# Patient Record
Sex: Female | Born: 1960 | Race: White | Hispanic: No | Marital: Married | Smoking: Current every day smoker
Health system: Southern US, Community
[De-identification: ages and names within clinical notes are randomized; demographics above are authoritative.]

## PROBLEM LIST (undated history)

## (undated) DIAGNOSIS — I639 Cerebral infarction, unspecified: Secondary | ICD-10-CM

## (undated) DIAGNOSIS — F32A Depression, unspecified: Secondary | ICD-10-CM

## (undated) DIAGNOSIS — R011 Cardiac murmur, unspecified: Secondary | ICD-10-CM

## (undated) DIAGNOSIS — J449 Chronic obstructive pulmonary disease, unspecified: Secondary | ICD-10-CM

## (undated) DIAGNOSIS — C569 Malignant neoplasm of unspecified ovary: Secondary | ICD-10-CM

## (undated) DIAGNOSIS — F419 Anxiety disorder, unspecified: Secondary | ICD-10-CM

## (undated) DIAGNOSIS — G43909 Migraine, unspecified, not intractable, without status migrainosus: Secondary | ICD-10-CM

## (undated) DIAGNOSIS — M069 Rheumatoid arthritis, unspecified: Secondary | ICD-10-CM

## (undated) DIAGNOSIS — J45909 Unspecified asthma, uncomplicated: Secondary | ICD-10-CM

## (undated) DIAGNOSIS — I509 Heart failure, unspecified: Secondary | ICD-10-CM

## (undated) DIAGNOSIS — Z9289 Personal history of other medical treatment: Secondary | ICD-10-CM

## (undated) DIAGNOSIS — J42 Unspecified chronic bronchitis: Secondary | ICD-10-CM

## (undated) DIAGNOSIS — D649 Anemia, unspecified: Secondary | ICD-10-CM

## (undated) DIAGNOSIS — E785 Hyperlipidemia, unspecified: Secondary | ICD-10-CM

## (undated) DIAGNOSIS — G709 Myoneural disorder, unspecified: Secondary | ICD-10-CM

## (undated) DIAGNOSIS — F329 Major depressive disorder, single episode, unspecified: Secondary | ICD-10-CM

## (undated) HISTORY — PX: COLONOSCOPY: SHX174

## (undated) HISTORY — PX: TOTAL HIP ARTHROPLASTY: SHX124

## (undated) HISTORY — PX: JOINT REPLACEMENT: SHX530

---
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
## 1979-01-28 HISTORY — PX: RADIAL HEAD ARTHROPLASTY: SUR75

## 2008-11-12 ENCOUNTER — Ambulatory Visit (HOSPITAL_BASED_OUTPATIENT_CLINIC_OR_DEPARTMENT_OTHER): Admission: RE | Admit: 2008-11-12 | Discharge: 2008-11-12 | Payer: Self-pay | Admitting: Orthopedic Surgery

## 2009-05-29 HISTORY — PX: ABDOMINAL HYSTERECTOMY: SHX81

## 2010-01-13 ENCOUNTER — Emergency Department (HOSPITAL_COMMUNITY)
Admission: EM | Admit: 2010-01-13 | Discharge: 2010-01-14 | Payer: Self-pay | Source: Home / Self Care | Admitting: Emergency Medicine

## 2010-04-28 DIAGNOSIS — I639 Cerebral infarction, unspecified: Secondary | ICD-10-CM

## 2010-04-28 HISTORY — DX: Cerebral infarction, unspecified: I63.9

## 2010-05-17 ENCOUNTER — Encounter (INDEPENDENT_AMBULATORY_CARE_PROVIDER_SITE_OTHER): Payer: Self-pay | Admitting: Neurology

## 2010-05-17 ENCOUNTER — Inpatient Hospital Stay (HOSPITAL_COMMUNITY)
Admission: EM | Admit: 2010-05-17 | Discharge: 2010-05-20 | Disposition: A | Discharge status: Rehabilitation Facility | Payer: Self-pay | Source: Home / Self Care | Attending: Neurology | Admitting: Neurology

## 2010-05-18 ENCOUNTER — Encounter (INDEPENDENT_AMBULATORY_CARE_PROVIDER_SITE_OTHER): Payer: Self-pay | Admitting: Neurology

## 2010-05-20 ENCOUNTER — Inpatient Hospital Stay (HOSPITAL_COMMUNITY)
Admission: RE | Admit: 2010-05-20 | Discharge: 2010-05-27 | Payer: Self-pay | Admitting: Physical Medicine & Rehabilitation

## 2010-05-23 ENCOUNTER — Inpatient Hospital Stay (HOSPITAL_COMMUNITY)
Admission: RE | Admit: 2010-05-23 | Discharge: 2010-05-27 | Payer: Self-pay | Attending: Physical Medicine & Rehabilitation | Admitting: Physical Medicine & Rehabilitation

## 2010-05-30 ENCOUNTER — Encounter
Admission: RE | Admit: 2010-05-30 | Discharge: 2010-06-14 | Payer: Self-pay | Source: Home / Self Care | Attending: Physical Medicine & Rehabilitation | Admitting: Physical Medicine & Rehabilitation

## 2010-06-19 ENCOUNTER — Encounter: Payer: Self-pay | Admitting: Physical Medicine & Rehabilitation

## 2010-06-23 NOTE — H&P (Addendum)
NAME:  Anne Stevens, Anne Stevens NO.:  [id number]  MEDICAL RECORD NO.:  [id number]         PATIENT TYPE:  INP  LOCATION:  3111                         FACILITY:  [facility]  PHYSICIAN:  Marlan Palau, M.D.  DATE OF BIRTH:  23-Dec-1960  DATE OF ADMISSION:  05/17/2010 DATE OF DISCHARGE:                             HISTORY & PHYSICAL   HISTORY OF PRESENT ILLNESS:  Anne Stevens is a 50 year old right-handed white female, born on November 19, 1960, with a history of tobacco abuse. The patient has recently been diagnosed with ovarian cancer in August 2011 and had surgery to remove tumor including hysterectomy in September 2011.  The patient was felt to have tumor that was localized to the pelvis without distant metastasis.  The patient has been undergoing chemotherapy, has had three sessions of this.  The patient was in otherwise fairly good health until the day of admission.  Around 10 minutes still 10 o'clock in the morning today, the patient was noted to have onset of speech disturbance and right facial droop that was noted by her husband.  The patient was last seen normal within 10 minutes of that time.  The patient developed right-sided weakness, the EMS was called.  The patient was brought into the hospital and appeared to be somewhat mute, unable to generate language with wipeout of the right arm and severe weakness of the right leg and right homonymous visual field deficit.  CT scan of the brain was done and revealed no acute changes. CT angiogram was performed and showed patent carotid system on the left and M1 segment was patent.  There appear to be some branches cutoffs apparent on CT angiogram.  The patient was admitted to [facility] after full dosed IV t-PA.  PAST MEDICAL HISTORY:  Significant for: 1. Ovarian cancer, status post resection. 2. Tobacco abuse. 3. Rheumatoid arthritis. 4. Depression and anxiety. 5. Ovarian resection, age 11 for benign  lesion.  The patient is on; 1. Promethazine 25 mg q.4 h. if needed. 2. Zofran 8 mg q.8 h. if needed. 3. Iron 325 mg twice daily. 4. Neurontin 600 mg three times daily. 5. Valacyclovir 500 mg daily. 6. Effexor 150 mg b.i.d. 7. Clonazepam 1 mg twice daily. 8. The patient takes Dilaudid 2 mg tablets every 6 hours if needed. 9. Takes prednisone 10 mg daily. 10.Advair inhaler 250/50 two puffs twice daily. 11.Docusate 100 mg daily.  The patient has no known allergies.  Smokes a pack of cigarettes daily. Does drink alcohol on occasion.  Again no known drug allergies.  FAMILY MEDICAL HISTORY:  Notable for mother died with stroke.  Father died with the heart attack.  The patient has three brothers, no sisters. Two brothers have heart disease.  One brother is alive and well.  SOCIAL HISTORY:  The patient is married, has no children, was on disability for her rheumatoid arthritis, lives in the [place] area.  The patient has no local primary care physician.  The patient has followed through [facility]  REVIEW OF SYSTEMS:  Notable for as per husband some chronic shortness of breath.  The patient  has had intermittent episodes of right hand and arm numbness, possibly some clumsiness over the last 6-7 weeks that would come and go.  The patient would have occasional headaches.  Has some problems with constipation.  Denies any problems with bladder control.  PHYSICAL EXAMINATION:  VITAL SIGNS:  Blood pressure is 146/96, heart rate 76, respiratory rate 16, temperature afebrile. GENERAL:  The patient is a minimally obese, white female who is alert, aphasic at the time of examination. HEENT:  Head is atraumatic.  Eyes; pupils are equal, round, and reactive to light. NECK:  Supple.  No carotid bruits noted. RESPIRATORY:  Clear. CARDIOVASCULAR:  Reveals a regular rate and rhythm.  No obvious murmurs or rubs noted. EXTREMITIES:  Without significant edema. ABDOMEN:  Reveals  positive bowel sounds.  No organomegaly or tenderness noted. NEUROLOGIC:  Cranial nerves a above.  Facial symmetry is not present with depression at the right nasolabial fold with smiling grimace.  The patient has difficulty cutting her eyes fully to the right, but could almost get to that point, has good gaze to the left.  The patient blinks to threat from the left, not from the right.  Speech is garbled, nonsensical.  The patient cannot follow pure verbal commands.  The patient does seem to respond to pinprick sensation bilaterally on the face.  The patient has some drift to the bed with the right arm, almost to the bed with the right leg, has no drift on the left side.  The patient appears to have depressed, but symmetric reflexes.  Toes are neutral bilaterally.  Sensory testing is difficult, but the patient does respond to deep pain sensation on all fours.  The patient does not appear to have any obvious ataxia over the left arm or left leg. Difficult to perform on the right side due to weakness.  The patient could not be ambulated.  Testing for double simultaneous stimulation is very difficult.  The patient is unable to read or name objects.  NIH stroke scale score is 14.  CT of the head and CTA of the head and neck are as above.  LABORATORY VALUES:  Notable for a sodium of 135, potassium 3.7, chloride of 107, CO2 17 glucose of 94, BUN of 23, creatinine 1.18.  Total bili of 0.4, alk phosphatase of 79, SGOT of 16, SGPT of 15, total protein 7.0, albumin 3.6, calcium 9.3, CK of 48, MB fraction 1.0, troponin I 0.06. White count 5.0, hemoglobin of 10.2, hematocrit of 29.3, MCV of 93.9, platelets of 111.  Again, INR 0.91.  IMPRESSION: 1. Onset of left brain stroke. 2. History of tobacco abuse. 3. Ovarian cancer.  This patient is felt to be a tPA candidate.  Last surgery was in September 2011 and according to the husband, the cancer is localized at the pelvis and not widely  metastatic.  The patient has had a CT angiogram that did not show any arterial occlusions that were accessible by angiography.  For this reason, the patient will be given full-dosed IV t-PA and admitted to the Intensive Care Unit for further management.  The patient will be follow closely while in-house.     Marlan Palau, M.D.     CKW/MEDQ  D:  05/17/2010  T:  05/18/2010  Job:  [id number]  cc:   [facility]  Electronically Signed by Thana Farr M.D. on 06/23/2010 08:48:38 AM

## 2010-07-05 ENCOUNTER — Inpatient Hospital Stay (HOSPITAL_BASED_OUTPATIENT_CLINIC_OR_DEPARTMENT_OTHER): Payer: Federal, State, Local not specified - PPO | Admitting: Physical Medicine & Rehabilitation

## 2010-07-05 ENCOUNTER — Ambulatory Visit: Payer: Federal, State, Local not specified - PPO | Attending: Physical Medicine & Rehabilitation

## 2010-07-05 DIAGNOSIS — G8190 Hemiplegia, unspecified affecting unspecified side: Secondary | ICD-10-CM | POA: Insufficient documentation

## 2010-07-05 DIAGNOSIS — I69921 Dysphasia following unspecified cerebrovascular disease: Secondary | ICD-10-CM

## 2010-07-05 DIAGNOSIS — I69920 Aphasia following unspecified cerebrovascular disease: Secondary | ICD-10-CM | POA: Insufficient documentation

## 2010-07-05 DIAGNOSIS — Z79899 Other long term (current) drug therapy: Secondary | ICD-10-CM | POA: Insufficient documentation

## 2010-07-05 DIAGNOSIS — C569 Malignant neoplasm of unspecified ovary: Secondary | ICD-10-CM | POA: Insufficient documentation

## 2010-07-05 DIAGNOSIS — Z7902 Long term (current) use of antithrombotics/antiplatelets: Secondary | ICD-10-CM | POA: Insufficient documentation

## 2010-07-05 DIAGNOSIS — IMO0002 Reserved for concepts with insufficient information to code with codable children: Secondary | ICD-10-CM | POA: Insufficient documentation

## 2010-07-05 DIAGNOSIS — I6330 Cerebral infarction due to thrombosis of unspecified cerebral artery: Secondary | ICD-10-CM

## 2010-07-05 DIAGNOSIS — G8100 Flaccid hemiplegia affecting unspecified side: Secondary | ICD-10-CM

## 2010-07-08 NOTE — Discharge Summary (Signed)
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  This is a 50 year old right-handed white female with history of tobacco abuse, recent diagnosis of ovarian cancer in August 2011 with resection and hysterectomy with chemotherapy x3 sessions who was admitted on December 20 with right-sided weakness and aphasia.  MRI showed embolic left middle cerebral artery infarction.  Echocardiogram with ejection fraction 60%.  TEE with no source of emboli.  No PFO noted.  CTA of the neck without stenosis.  CTA of the head normal except for a unopacified insular branch of the left middle cerebral artery consistent with embolic occlusion.  The patient did receive TPA.  Placed on aspirin, subcutaneous Lovenox.  Modified barium swallow December 22, placed on a dysphagia 1 honey thick-liquid diet.  No current plan for chemo at this time to recovery from stroke and then follow up with her oncologist Dr. Noland Fordyce at [facility].  She was total assist for mobility.  She was admitted for comprehensive rehab program.  PAST MEDICAL HISTORY:  See discharge diagnoses.  ALLERGIES:  None.  No alcohol.  She smokes approximately one half to pack per day.  SOCIAL HISTORY:  Married.  She is on disability for rheumatoid arthritis.  Husband works and travels for his job.  She has a brother in the area with good  support.  Living in a 1-level home with 4 steps to entry.  Functional history prior to admission was independent.  Functional status upon admission to rehab services was minimal assist bed mobility, minimal assist transfers, +2 total assist to ambulate 15 feet x2, minimal assist for grooming and toileting.  MEDICATIONS:  Prior to admission were; 1. Flexeril as needed. 2. Ultram as needed. 3. Lutein over-the-counter 1 tablet daily. 4. Fish oil 1 g daily. 5. Folic acid 1 mg daily. 6. Calcium carbonate daily. 7. Celebrex 200 mg daily. 8. Effexor 75 mg daily. 9. Zofran as needed. 10.Percocet every 4 hours as needed. 11.Clonazepam 1 mg twice daily. 12.Vitamin D 50,000 units weekly. 13.Premarin 0.45 mg daily. 14.Prednisone 10 mg every morning. 15.Boniva 150 mg monthly.  PHYSICAL EXAMINATION:  VITAL SIGNS:  Blood pressure 162/95, pulse 81, temperature 97.5, respirations 20. GENERAL:  This was an alert female, anxious, aphasic as well as apraxic. Deep tendon reflexes hyperactive.  Sensation decreased on the right side. LUNGS:  Clear to auscultation. CARDIAC:  Regular rate and rhythm. ABDOMEN:  Soft, nontender.  Good bowel sounds.  REHABILITATION HOSPITAL COURSE:  The patient was admitted to inpatient rehab services with therapies initiated on a 3-hour daily basis consisting of physical therapy, occupational therapy, speech  therapy and rehabilitation nursing.  The following issues were addressed during the patient's rehabilitation stay.  Pertaining to [name]'s left middle cerebral artery infarction with right hemiplegia, remained stable, maintained on subcutaneous Lovenox for deep vein thrombosis prophylaxis throughout her rehab course as well as aspirin therapy.  She would follow up Neurology Services, Dr. Lesia Sago.  Pain management with the use of Vicodin as well as Neurontin 600 mg every 8 hours.  She was followed closely by speech therapy for dysphagia as well as her  aphasia. She was very apraxic.  This had greatly improved.  Her diet was advanced on a dysphagia 1 honey thick-liquid diet to a regular consistency which she tolerated well.  She did have a history of ovarian cancer with resection and chemotherapy.  She would follow up with Dr. Hazle Coca at [facility] for ongoing plan for chemotherapy.  She recovered from her stroke.  She was maintained on low-dose prednisone for her history of rheumatoid arthritis that she was on disability for.  She continued on her Crestor for hyperlipidemia.  She did have a documented history of anemia with latest hemoglobin of 9.1, hematocrit 27, and she continued on iron supplement.  The patient received weekly collaborative interdisciplinary team conferences to discuss estimated length of stay, family teaching and any barriers to discharge.  She was now supervision overall for her mobility, supervision overall for activities of daily living except for some assistance for her shoes.  Her strength and endurance had greatly improve as well as her anxiety.  She was advised of no driving.  Full family teaching was completed and plan was to be discharged to home.  DISCHARGE MEDICATIONS:  At time of dictation included; 1. Aspirin 325 mg daily. 2. Colace 100 mg twice daily. 3. Ferrous sulfate 325 mg twice daily. 4. Neurontin 600 mg every 8 hours. 5. Prednisone 10 mg daily. 6. Crestor 10 mg daily. 7. Valtrex 500 mg daily. 8. Effexor 150 mg twice daily. 9. Klonopin 1 mg every 12 hours. 10.Vicodin 5/325 one or two tablets every 4 hours as needed for pain,     dispense 60 tablets.  Her diet was a regular diet, thin liquids with occasional throat clearing.  Meds could be given whole with thin liquids.  SPECIAL INSTRUCTIONS:  The patient should follow up Dr. Hazle Coca at [facility] for ongoing plan for chemotherapy as related to recent diagnosis of ovarian cancer with resection.   She should follow up with Dr. Lesia Sago of Neurology services.  Questions made in regards to resuming her Premarin, could be addressed per her oncologist at [facility] after recent stroke as this had not been resumed.  She would follow up with Dr. Claudette Laws at the outpatient rehab center as advised.  Outpatient therapies had been arranged.     Mariam Dollar, P.A.   ______________________________ Erick Colace, M.D.    DA/MEDQ  D:  05/27/2010  T:  05/27/2010  Job:  [id number]  cc:   Erick Colace, M.D. Marlan Palau, M.D. Hazle Coca  Electronically Signed by Mariam Dollar P.A. on 06/28/2010 03:08:15 PM Electronically Signed by Claudette Laws M.D. on 07/08/2010 04:23:34 PM

## 2010-08-08 LAB — URINALYSIS, ROUTINE W REFLEX MICROSCOPIC
Bilirubin Urine: NEGATIVE
Glucose, UA: NEGATIVE mg/dL
Nitrite: NEGATIVE
Specific Gravity, Urine: 1.017 (ref 1.005–1.030)
pH: 6 (ref 5.0–8.0)

## 2010-08-08 LAB — GLUCOSE, CAPILLARY
Glucose-Capillary: 109 mg/dL — ABNORMAL HIGH (ref 70–99)
Glucose-Capillary: 150 mg/dL — ABNORMAL HIGH (ref 70–99)
Glucose-Capillary: 75 mg/dL (ref 70–99)
Glucose-Capillary: 97 mg/dL (ref 70–99)

## 2010-08-08 LAB — CBC
HCT: 27.1 % — ABNORMAL LOW (ref 36.0–46.0)
HCT: 29 % — ABNORMAL LOW (ref 36.0–46.0)
HCT: 29.3 % — ABNORMAL LOW (ref 36.0–46.0)
Hemoglobin: 9.6 g/dL — ABNORMAL LOW (ref 12.0–15.0)
MCH: 32.5 pg (ref 26.0–34.0)
MCH: 32.7 pg (ref 26.0–34.0)
MCHC: 33.6 g/dL (ref 30.0–36.0)
MCHC: 33.8 g/dL (ref 30.0–36.0)
MCHC: 33.9 g/dL (ref 30.0–36.0)
MCV: 96.8 fL (ref 78.0–100.0)
Platelets: 74 10*3/uL — ABNORMAL LOW (ref 150–400)
Platelets: 81 10*3/uL — ABNORMAL LOW (ref 150–400)
Platelets: 96 10*3/uL — ABNORMAL LOW (ref 150–400)
RBC: 2.96 MIL/uL — ABNORMAL LOW (ref 3.87–5.11)
RDW: 18.3 % — ABNORMAL HIGH (ref 11.5–15.5)
WBC: 5.9 10*3/uL (ref 4.0–10.5)

## 2010-08-08 LAB — DIFFERENTIAL
Basophils Absolute: 0 10*3/uL (ref 0.0–0.1)
Basophils Absolute: 0 10*3/uL (ref 0.0–0.1)
Basophils Relative: 0 % (ref 0–1)
Basophils Relative: 0 % (ref 0–1)
Eosinophils Absolute: 0 10*3/uL (ref 0.0–0.7)
Eosinophils Absolute: 0 10*3/uL (ref 0.0–0.7)
Eosinophils Relative: 0 % (ref 0–5)
Eosinophils Relative: 1 % (ref 0–5)
Lymphocytes Relative: 50 % — ABNORMAL HIGH (ref 12–46)
Lymphs Abs: 2.7 10*3/uL (ref 0.7–4.0)
Monocytes Absolute: 0.4 10*3/uL (ref 0.1–1.0)
Monocytes Absolute: 0.5 10*3/uL (ref 0.1–1.0)
Monocytes Absolute: 0.6 10*3/uL (ref 0.1–1.0)
Monocytes Relative: 9 % (ref 3–12)
Neutro Abs: 1.2 10*3/uL — ABNORMAL LOW (ref 1.7–7.7)

## 2010-08-08 LAB — COMPREHENSIVE METABOLIC PANEL
ALT: 18 U/L (ref 0–35)
AST: 16 U/L (ref 0–37)
AST: 19 U/L (ref 0–37)
Albumin: 3.3 g/dL — ABNORMAL LOW (ref 3.5–5.2)
Alkaline Phosphatase: 79 U/L (ref 39–117)
BUN: 23 mg/dL (ref 6–23)
CO2: 17 mEq/L — ABNORMAL LOW (ref 19–32)
CO2: 26 mEq/L (ref 19–32)
Calcium: 8.9 mg/dL (ref 8.4–10.5)
Calcium: 9.3 mg/dL (ref 8.4–10.5)
Chloride: 107 mEq/L (ref 96–112)
Chloride: 108 mEq/L (ref 96–112)
GFR calc Af Amer: 59 mL/min — ABNORMAL LOW (ref >60)
GFR calc Af Amer: >60 mL/min (ref >60)
GFR calc non Af Amer: 48 mL/min — ABNORMAL LOW (ref >60)
GFR calc non Af Amer: 57 mL/min — ABNORMAL LOW (ref >60)
Glucose, Bld: 94 mg/dL (ref 70–99)
Potassium: 3.7 mEq/L (ref 3.5–5.1)
Sodium: 142 mEq/L (ref 135–145)

## 2010-08-08 LAB — BASIC METABOLIC PANEL
CO2: 22 mEq/L (ref 19–32)
Chloride: 111 mEq/L (ref 96–112)
Potassium: 4.1 mEq/L (ref 3.5–5.1)

## 2010-08-08 LAB — LIPID PANEL
Cholesterol: 232 mg/dL — ABNORMAL HIGH (ref 0–200)
LDL Cholesterol: 144 mg/dL — ABNORMAL HIGH (ref 0–99)
Triglycerides: 140 mg/dL (ref <150)
VLDL: 28 mg/dL (ref 0–40)

## 2010-08-08 LAB — CK TOTAL AND CKMB (NOT AT ARMC)
Relative Index: INVALID (ref 0.0–2.5)
Total CK: 48 U/L (ref 7–177)

## 2010-08-08 LAB — APTT: aPTT: 32 seconds (ref 24–37)

## 2010-08-08 LAB — MRSA PCR SCREENING: MRSA by PCR: NEGATIVE

## 2010-08-08 LAB — CARDIAC PANEL(CRET KIN+CKTOT+MB+TROPI): Relative Index: INVALID (ref 0.0–2.5)

## 2010-08-08 LAB — HEMOGLOBIN A1C
Hgb A1c MFr Bld: 6.2 % — ABNORMAL HIGH (ref <5.7)
Mean Plasma Glucose: 131 mg/dL — ABNORMAL HIGH (ref <117)

## 2010-08-08 LAB — POCT I-STAT, CHEM 8
Chloride: 109 mEq/L (ref 96–112)
Hemoglobin: 10.2 g/dL — ABNORMAL LOW (ref 12.0–15.0)
Sodium: 137 mEq/L (ref 135–145)

## 2010-08-08 LAB — URINE MICROSCOPIC-ADD ON

## 2010-08-08 LAB — PROTIME-INR: Prothrombin Time: 12.5 seconds (ref 11.6–15.2)

## 2010-08-12 LAB — CBC
HCT: 39.3 % (ref 36.0–46.0)
Hemoglobin: 13.5 g/dL (ref 12.0–15.0)
MCH: 32.8 pg (ref 26.0–34.0)
MCHC: 34.4 g/dL (ref 30.0–36.0)
RBC: 4.12 MIL/uL (ref 3.87–5.11)

## 2010-08-12 LAB — COMPREHENSIVE METABOLIC PANEL
ALT: 11 U/L (ref 0–35)
AST: 17 U/L (ref 0–37)
CO2: 24 mEq/L (ref 19–32)
Chloride: 107 mEq/L (ref 96–112)
GFR calc Af Amer: >60 mL/min (ref >60)
GFR calc non Af Amer: 50 mL/min — ABNORMAL LOW (ref >60)
Glucose, Bld: 103 mg/dL — ABNORMAL HIGH (ref 70–99)
Sodium: 138 mEq/L (ref 135–145)
Total Bilirubin: 0.5 mg/dL (ref 0.3–1.2)

## 2010-08-12 LAB — URINALYSIS, ROUTINE W REFLEX MICROSCOPIC
Bilirubin Urine: NEGATIVE
Glucose, UA: NEGATIVE mg/dL
Hgb urine dipstick: NEGATIVE
Ketones, ur: NEGATIVE mg/dL
Nitrite: NEGATIVE
Specific Gravity, Urine: 1.021 (ref 1.005–1.030)
pH: 6 (ref 5.0–8.0)

## 2010-08-12 LAB — LIPASE, BLOOD: Lipase: 20 U/L (ref 11–59)

## 2010-08-12 LAB — DIFFERENTIAL
Basophils Absolute: 0 10*3/uL (ref 0.0–0.1)
Basophils Relative: 0 % (ref 0–1)
Eosinophils Absolute: 0.1 10*3/uL (ref 0.0–0.7)
Eosinophils Relative: 1 % (ref 0–5)
Neutrophils Relative %: 75 % (ref 43–77)

## 2010-08-12 LAB — POCT PREGNANCY, URINE: Preg Test, Ur: NEGATIVE

## 2010-08-12 LAB — URINE CULTURE
Colony Count: >=100000
Culture  Setup Time: 201108190028

## 2010-08-12 LAB — WET PREP, GENITAL: Clue Cells Wet Prep HPF POC: NONE SEEN

## 2010-08-12 LAB — GC/CHLAMYDIA PROBE AMP, GENITAL: GC Probe Amp, Genital: NEGATIVE

## 2010-08-18 ENCOUNTER — Ambulatory Visit: Payer: Federal, State, Local not specified - PPO | Admitting: Physical Medicine & Rehabilitation

## 2010-08-26 ENCOUNTER — Encounter: Payer: Federal, State, Local not specified - PPO | Attending: Physical Medicine & Rehabilitation

## 2010-08-26 ENCOUNTER — Ambulatory Visit (HOSPITAL_BASED_OUTPATIENT_CLINIC_OR_DEPARTMENT_OTHER): Payer: Federal, State, Local not specified - PPO | Admitting: Physical Medicine & Rehabilitation

## 2010-08-26 DIAGNOSIS — G8110 Spastic hemiplegia affecting unspecified side: Secondary | ICD-10-CM

## 2010-08-26 DIAGNOSIS — I69920 Aphasia following unspecified cerebrovascular disease: Secondary | ICD-10-CM | POA: Insufficient documentation

## 2010-08-26 DIAGNOSIS — I69998 Other sequelae following unspecified cerebrovascular disease: Secondary | ICD-10-CM | POA: Insufficient documentation

## 2010-08-26 DIAGNOSIS — Z9221 Personal history of antineoplastic chemotherapy: Secondary | ICD-10-CM | POA: Insufficient documentation

## 2010-08-26 DIAGNOSIS — R209 Unspecified disturbances of skin sensation: Secondary | ICD-10-CM | POA: Insufficient documentation

## 2010-08-26 DIAGNOSIS — F17200 Nicotine dependence, unspecified, uncomplicated: Secondary | ICD-10-CM | POA: Insufficient documentation

## 2010-08-26 DIAGNOSIS — R279 Unspecified lack of coordination: Secondary | ICD-10-CM | POA: Insufficient documentation

## 2010-08-26 DIAGNOSIS — I69959 Hemiplegia and hemiparesis following unspecified cerebrovascular disease affecting unspecified side: Secondary | ICD-10-CM | POA: Insufficient documentation

## 2010-08-26 DIAGNOSIS — C569 Malignant neoplasm of unspecified ovary: Secondary | ICD-10-CM | POA: Insufficient documentation

## 2010-08-26 DIAGNOSIS — I69921 Dysphasia following unspecified cerebrovascular disease: Secondary | ICD-10-CM

## 2010-09-05 LAB — POCT I-STAT, CHEM 8
BUN: 23 mg/dL (ref 6–23)
Calcium, Ion: 1.15 mmol/L (ref 1.12–1.32)
Creatinine, Ser: 1.3 mg/dL — ABNORMAL HIGH (ref 0.4–1.2)
Glucose, Bld: 101 mg/dL — ABNORMAL HIGH (ref 70–99)
Hemoglobin: 13.3 g/dL (ref 12.0–15.0)
TCO2: 23 mmol/L (ref 0–100)

## 2010-09-14 ENCOUNTER — Ambulatory Visit: Payer: Federal, State, Local not specified - PPO | Admitting: Occupational Therapy

## 2010-09-14 ENCOUNTER — Ambulatory Visit: Payer: Federal, State, Local not specified - PPO | Attending: Physical Medicine & Rehabilitation

## 2010-09-14 DIAGNOSIS — R269 Unspecified abnormalities of gait and mobility: Secondary | ICD-10-CM | POA: Insufficient documentation

## 2010-09-14 DIAGNOSIS — Z5189 Encounter for other specified aftercare: Secondary | ICD-10-CM | POA: Insufficient documentation

## 2010-09-14 DIAGNOSIS — R279 Unspecified lack of coordination: Secondary | ICD-10-CM | POA: Insufficient documentation

## 2010-09-14 DIAGNOSIS — I69998 Other sequelae following unspecified cerebrovascular disease: Secondary | ICD-10-CM | POA: Insufficient documentation

## 2010-09-14 DIAGNOSIS — R482 Apraxia: Secondary | ICD-10-CM | POA: Insufficient documentation

## 2010-09-14 DIAGNOSIS — I69922 Dysarthria following unspecified cerebrovascular disease: Secondary | ICD-10-CM | POA: Insufficient documentation

## 2010-09-14 DIAGNOSIS — I69920 Aphasia following unspecified cerebrovascular disease: Secondary | ICD-10-CM | POA: Insufficient documentation

## 2010-09-14 DIAGNOSIS — M6281 Muscle weakness (generalized): Secondary | ICD-10-CM | POA: Insufficient documentation

## 2010-09-19 ENCOUNTER — Encounter: Payer: Federal, State, Local not specified - PPO | Admitting: Occupational Therapy

## 2010-09-19 ENCOUNTER — Ambulatory Visit: Payer: Federal, State, Local not specified - PPO | Admitting: Rehabilitative and Restorative Service Providers"

## 2010-09-22 ENCOUNTER — Encounter: Payer: Federal, State, Local not specified - PPO | Admitting: Occupational Therapy

## 2010-09-23 ENCOUNTER — Ambulatory Visit: Payer: Federal, State, Local not specified - PPO | Admitting: Rehabilitative and Restorative Service Providers"

## 2010-09-26 ENCOUNTER — Ambulatory Visit: Payer: Federal, State, Local not specified - PPO | Admitting: Physical Therapy

## 2010-09-26 ENCOUNTER — Ambulatory Visit: Payer: Federal, State, Local not specified - PPO | Admitting: Speech Pathology

## 2010-09-26 ENCOUNTER — Ambulatory Visit: Payer: Federal, State, Local not specified - PPO | Admitting: Rehabilitative and Restorative Service Providers"

## 2010-09-27 ENCOUNTER — Encounter: Payer: Federal, State, Local not specified - PPO | Attending: Physical Medicine & Rehabilitation

## 2010-09-27 ENCOUNTER — Ambulatory Visit: Payer: Federal, State, Local not specified - PPO | Admitting: Physical Medicine & Rehabilitation

## 2010-09-27 DIAGNOSIS — I69921 Dysphasia following unspecified cerebrovascular disease: Secondary | ICD-10-CM

## 2010-09-27 DIAGNOSIS — I69959 Hemiplegia and hemiparesis following unspecified cerebrovascular disease affecting unspecified side: Secondary | ICD-10-CM | POA: Insufficient documentation

## 2010-09-27 DIAGNOSIS — C569 Malignant neoplasm of unspecified ovary: Secondary | ICD-10-CM | POA: Insufficient documentation

## 2010-09-27 DIAGNOSIS — F17200 Nicotine dependence, unspecified, uncomplicated: Secondary | ICD-10-CM | POA: Insufficient documentation

## 2010-09-27 DIAGNOSIS — R209 Unspecified disturbances of skin sensation: Secondary | ICD-10-CM | POA: Insufficient documentation

## 2010-09-27 DIAGNOSIS — I69998 Other sequelae following unspecified cerebrovascular disease: Secondary | ICD-10-CM | POA: Insufficient documentation

## 2010-09-27 DIAGNOSIS — Z9221 Personal history of antineoplastic chemotherapy: Secondary | ICD-10-CM | POA: Insufficient documentation

## 2010-09-27 DIAGNOSIS — I69920 Aphasia following unspecified cerebrovascular disease: Secondary | ICD-10-CM | POA: Insufficient documentation

## 2010-09-27 DIAGNOSIS — R279 Unspecified lack of coordination: Secondary | ICD-10-CM | POA: Insufficient documentation

## 2010-09-27 DIAGNOSIS — I6330 Cerebral infarction due to thrombosis of unspecified cerebral artery: Secondary | ICD-10-CM

## 2010-09-29 NOTE — Assessment & Plan Note (Signed)
REASON FOR VISIT:  Right-sided weakness due to stroke as well as aphasia.  This is a 50 year old female who was previously working, diagnosed with ovarian CA on February 10, 2010, had a hysterectomy completed, 3/6 chemotherapies, but then on May 17, 2010, had a left middle cerebral artery stroke with right hemiparesis and dysphagia.  Other past medical history significant for RA.  She goes to [facility] for her RA as well as her ovarian CA.  Her next followup is in June with her oncologist.  She also has some type of thyroid mass that is being evaluated with ultrasound-guided biopsy.  She has had no other new problems in the interval time.  She has gone through inpatient and is now going through outpatient rehab.  She is making very good progress. She is back to driving.  Her speech is improving to the point where she can do short sentences.  She was employed 40 hours a week up until a couple of years ago.  Her husband does continue to work.  She is now independent with all of her self-care.  HABITS:  Nonsmoker.  REVIEW OF SYSTEMS:  Weakness, numbness, tingling, dizziness, and anxiety.  Pain complaints:  Right knee, recent prednisone burst and taper per Rheumatology.  PHYSICAL EXAMINATION:  VITAL SIGNS:  Blood pressure 129/78, pulse 94, respirations 18, and sat 99% room air. GENERAL:  No acute stress.  Mood and affect appropriate. BACK:  Her back has no tenderness to palpation. NEUROLOGIC:  She has 5/5 strength in right upper and right lower extremity.  She has mild incoordination in the right upper extremity and finger-thumb opposition as well as mild dysdiadochokinesis on rapid alternating pronation and supination of the right upper extremity. Speech remains dysarthric and aphasic.  She has difficulty repeating words such as methodist and episcopal.  Also has difficulty repeating no ifs, ands, or buts.  Her gait shows no evidence of toe drag or knee instability.  Visual  fields are intact to confrontation testing. Extraocular muscles are intact.  Right knee has no evidence of edema. No evidence of effusion.  No evidence of erythema or warmth.  No tenderness to palpation.  IMPRESSION: 1. Left middle cerebral artery distribution cerebrovascular accident     with right hemiparesis which has now largely resolved.  She has     some mild incoordination as well as balance disorder.  Her major     problem is aphasia as well as some apraxia of speech. 2. History of ovarian cancer.  She follows with Oncology in [place]. 3. Question of thyroid mass.  Biopsy pending. 4. Rheumatoid arthritis, sees a rheumatologist at [facility]. 5. Primary care.  She is to work in [employer] now.  She would like to     have a primary care physician close to     home.  She lives in [place].  We will make referral to Dr.     Sanda Linger assuming that he is taking new patients.     Erick Colace, M.D. Electronically Signed    AEK/MedQ D:  09/27/2010 10:11:19  T:  09/27/2010 23:15:13  Job #:  [id number]  cc:   Sanda Linger, MD [address] [place] [address] [place] [address]  [facility]

## 2010-09-30 ENCOUNTER — Ambulatory Visit: Payer: Federal, State, Local not specified - PPO | Admitting: *Deleted

## 2010-10-06 ENCOUNTER — Ambulatory Visit: Payer: Federal, State, Local not specified - PPO | Attending: Physical Medicine & Rehabilitation

## 2010-10-06 ENCOUNTER — Ambulatory Visit: Payer: Federal, State, Local not specified - PPO | Admitting: Rehabilitative and Restorative Service Providers"

## 2010-10-06 ENCOUNTER — Ambulatory Visit: Payer: Federal, State, Local not specified - PPO | Admitting: Internal Medicine

## 2010-10-06 ENCOUNTER — Ambulatory Visit: Payer: Federal, State, Local not specified - PPO | Admitting: Occupational Therapy

## 2010-10-06 DIAGNOSIS — I69998 Other sequelae following unspecified cerebrovascular disease: Secondary | ICD-10-CM | POA: Insufficient documentation

## 2010-10-06 DIAGNOSIS — Z5189 Encounter for other specified aftercare: Secondary | ICD-10-CM | POA: Insufficient documentation

## 2010-10-06 DIAGNOSIS — R279 Unspecified lack of coordination: Secondary | ICD-10-CM | POA: Insufficient documentation

## 2010-10-06 DIAGNOSIS — I69920 Aphasia following unspecified cerebrovascular disease: Secondary | ICD-10-CM | POA: Insufficient documentation

## 2010-10-06 DIAGNOSIS — R269 Unspecified abnormalities of gait and mobility: Secondary | ICD-10-CM | POA: Insufficient documentation

## 2010-10-06 DIAGNOSIS — R482 Apraxia: Secondary | ICD-10-CM | POA: Insufficient documentation

## 2010-10-06 DIAGNOSIS — M6281 Muscle weakness (generalized): Secondary | ICD-10-CM | POA: Insufficient documentation

## 2010-10-06 DIAGNOSIS — I69922 Dysarthria following unspecified cerebrovascular disease: Secondary | ICD-10-CM | POA: Insufficient documentation

## 2010-10-07 ENCOUNTER — Ambulatory Visit: Payer: Federal, State, Local not specified - PPO | Admitting: Rehabilitative and Restorative Service Providers"

## 2010-10-07 ENCOUNTER — Ambulatory Visit: Payer: Federal, State, Local not specified - PPO | Admitting: Occupational Therapy

## 2010-10-07 ENCOUNTER — Ambulatory Visit: Payer: Federal, State, Local not specified - PPO

## 2010-10-10 ENCOUNTER — Ambulatory Visit: Payer: Federal, State, Local not specified - PPO | Admitting: Occupational Therapy

## 2010-10-10 ENCOUNTER — Ambulatory Visit: Payer: Federal, State, Local not specified - PPO

## 2010-10-10 ENCOUNTER — Encounter: Payer: Federal, State, Local not specified - PPO | Admitting: Occupational Therapy

## 2010-10-10 ENCOUNTER — Ambulatory Visit: Payer: Federal, State, Local not specified - PPO | Admitting: Rehabilitative and Restorative Service Providers"

## 2010-10-11 NOTE — Op Note (Signed)
NAME:  Anne Stevens, Anne Stevens                 ACCOUNT NO.:  [id number]   MEDICAL RECORD NO.:  [id number]          PATIENT TYPE:  AMB   LOCATION:  DSC                          FACILITY:  [facility]   PHYSICIAN:  Katy Fitch. Sypher, M.D. DATE OF BIRTH:  07-Feb-1961   DATE OF PROCEDURE:  11/12/2008  DATE OF DISCHARGE:                               OPERATIVE REPORT   PREOPERATIVE DIAGNOSIS:  Comminuted articular impacted fracture, i.e.  pilon fracture left small finger middle phalanx at posterior  interphalangeal joint.   POSTOPERATIVE DIAGNOSIS:  Comminuted articular impacted fracture, i.e.  pilon fracture left small finger middle phalanx at posterior  interphalangeal joint.   OPERATION:  Closed reduction and percutaneous Kirschner wire fixation of  left small finger articular fracture at posterior interphalangeal joint.   OPERATIONS:  Katy Fitch. Sypher, MD   ASSISTANT:  Marveen Reeks Dasnoit, PA-C   ANESTHESIA:  General by LMA.   SUPERVISING ANESTHESIOLOGIST:  Guadalupe Maple, MD   INDICATION:  Anne Stevens is a 50 year old employee of [employer].  She was referred through the courtesy of Dr. Robert Bellow of the urgent  medical and family care center for evaluation and management of a  complex pilon fracture at the base of her left small finger middle  phalanx articular at the PIP joint.   She had fallen on a wet floor sustaining a direct injury to the finger.   Preoperatively, she was examined in the office.  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   We advised her as to the potential risks and benefits of treatment of  this fracture.   Anne Stevens has background rheumatoid arthritis and significant  osteopenia.  Given her rheumatoid arthritis and disease modifying  agents, she is a poor candidate for a traction fixation device in my  estimation due to possibility of pin tract infection and possible cutout  of the pins given her osteopenia.   In view of the complex nature of her fracture, I recommended that many  times it is better to keep it simple and simply closed reduce the  fracture and obtain as near anatomic reduction as possible.   Preoperatively, we advised her that we would attempt to treat her  fracture with intramedullary Kirschner wire fixation.  We will need to  work around her clinodactyly.   After informed consent, she is brought to the operating room at this  time.   PROCEDURE:  Anne Stevens was brought to the operating room and placed in  supine position on the operating table.   Following an anesthesia consult with Dr. Noreene Larsson, general anesthesia by  LMA technique was recommended and accepted by Anne Stevens.   She was brought to room #8 at the [facility] and placed in  supine position on the operating table and under Dr. Noreene Larsson direct  supervision, general anesthesia by LMA technique induced.   The left arm was prepped with Betadine soap solution and sterilely  draped.  A pneumatic tourniquet was applied to the proximal left  brachium, but not  inflated.   The procedure commenced with the use of a C-arm fluoroscope to examine  the fracture.   We could reduce the fracture by longitudinal traction and three-point  molding.  With great care, I threaded a 0.035-inch Kirschner wire  through the distal phalanx across the PIP joint accommodating the  clinodactyly and across the base of the proximal phalangeal head and  flexor sheath.  The point of this pin was used to prevent flexion of the  middle phalangeal fracture fragment into an apex volar deformity.   With three-point molding, I then trapped the main articular fragment and  secured it to the proximal phalangeal head from distal ulnar to proximal  radial.   An anatomic reduction, the middle phalanx was achieved.   The clinodactyly was accommodated.   The pin tracts were then dressed with Xeroflo followed by application of  a forearm-based  ulnar-sided splint incorporating the ring and small  fingers.  There were no apparent complications.   Anne Stevens was awakened from general anesthesia and transferred to the  recovery room in stable vital signs.   She will return to our office for followup in 1 week or sooner if have  any problems.   For aftercare, she was provided prescription for Percocet 5 mg one p.o.  q.4-6 h. p.r.n. pain, 30 tablets without refill, also Keflex 500 mg one  p.o. q.8 h. x4 days as a prophylactic antibiotic.   She was brought 1 g of Ancef as an IV prophylactic antibiotic prior to  pin placement.      Katy Fitch Sypher, M.D.  Electronically Signed    RVS/MEDQ  D:  11/12/2008  T:  11/13/2008  Job:  [id number]   cc:   Jonita Albee, M.D.  Jacqualine Code, MD

## 2010-10-12 ENCOUNTER — Ambulatory Visit: Payer: Federal, State, Local not specified - PPO

## 2010-10-12 ENCOUNTER — Ambulatory Visit: Payer: Federal, State, Local not specified - PPO | Admitting: Rehabilitative and Restorative Service Providers"

## 2010-10-12 ENCOUNTER — Ambulatory Visit: Payer: Federal, State, Local not specified - PPO | Admitting: Occupational Therapy

## 2010-10-14 ENCOUNTER — Ambulatory Visit: Payer: Federal, State, Local not specified - PPO | Admitting: Occupational Therapy

## 2010-10-14 ENCOUNTER — Ambulatory Visit: Payer: Federal, State, Local not specified - PPO

## 2010-10-17 ENCOUNTER — Ambulatory Visit: Payer: Federal, State, Local not specified - PPO | Admitting: Rehabilitative and Restorative Service Providers"

## 2010-10-17 ENCOUNTER — Ambulatory Visit: Payer: Federal, State, Local not specified - PPO

## 2010-10-17 ENCOUNTER — Ambulatory Visit: Payer: Federal, State, Local not specified - PPO | Admitting: Occupational Therapy

## 2010-10-18 ENCOUNTER — Ambulatory Visit: Payer: Federal, State, Local not specified - PPO | Admitting: Occupational Therapy

## 2010-10-18 ENCOUNTER — Ambulatory Visit: Payer: Federal, State, Local not specified - PPO

## 2010-10-19 ENCOUNTER — Ambulatory Visit: Payer: Federal, State, Local not specified - PPO

## 2010-10-19 ENCOUNTER — Ambulatory Visit: Payer: Federal, State, Local not specified - PPO | Admitting: Occupational Therapy

## 2010-10-19 ENCOUNTER — Ambulatory Visit: Payer: Federal, State, Local not specified - PPO | Admitting: Rehabilitative and Restorative Service Providers"

## 2010-10-25 ENCOUNTER — Ambulatory Visit: Payer: Federal, State, Local not specified - PPO | Admitting: Rehabilitative and Restorative Service Providers"

## 2010-10-25 ENCOUNTER — Encounter: Payer: Federal, State, Local not specified - PPO | Admitting: Occupational Therapy

## 2010-10-26 ENCOUNTER — Ambulatory Visit: Payer: Federal, State, Local not specified - PPO

## 2010-10-26 ENCOUNTER — Ambulatory Visit: Payer: Federal, State, Local not specified - PPO | Admitting: Occupational Therapy

## 2010-10-26 ENCOUNTER — Ambulatory Visit: Payer: Federal, State, Local not specified - PPO | Admitting: Rehabilitative and Restorative Service Providers"

## 2010-10-27 ENCOUNTER — Ambulatory Visit: Payer: Federal, State, Local not specified - PPO

## 2010-10-27 ENCOUNTER — Ambulatory Visit: Payer: Federal, State, Local not specified - PPO | Admitting: Occupational Therapy

## 2010-11-01 ENCOUNTER — Ambulatory Visit: Payer: Federal, State, Local not specified - PPO | Admitting: Physical Medicine & Rehabilitation

## 2010-11-01 ENCOUNTER — Encounter: Payer: Federal, State, Local not specified - PPO | Admitting: Occupational Therapy

## 2010-11-03 ENCOUNTER — Ambulatory Visit
Payer: Federal, State, Local not specified - PPO | Attending: Physical Medicine & Rehabilitation | Admitting: Occupational Therapy

## 2010-11-03 ENCOUNTER — Ambulatory Visit: Payer: Federal, State, Local not specified - PPO

## 2010-11-03 DIAGNOSIS — I69922 Dysarthria following unspecified cerebrovascular disease: Secondary | ICD-10-CM | POA: Insufficient documentation

## 2010-11-03 DIAGNOSIS — M6281 Muscle weakness (generalized): Secondary | ICD-10-CM | POA: Insufficient documentation

## 2010-11-03 DIAGNOSIS — R279 Unspecified lack of coordination: Secondary | ICD-10-CM | POA: Insufficient documentation

## 2010-11-03 DIAGNOSIS — Z5189 Encounter for other specified aftercare: Secondary | ICD-10-CM | POA: Insufficient documentation

## 2010-11-03 DIAGNOSIS — I69998 Other sequelae following unspecified cerebrovascular disease: Secondary | ICD-10-CM | POA: Insufficient documentation

## 2010-11-03 DIAGNOSIS — R269 Unspecified abnormalities of gait and mobility: Secondary | ICD-10-CM | POA: Insufficient documentation

## 2010-11-03 DIAGNOSIS — R482 Apraxia: Secondary | ICD-10-CM | POA: Insufficient documentation

## 2010-11-03 DIAGNOSIS — I69920 Aphasia following unspecified cerebrovascular disease: Secondary | ICD-10-CM | POA: Insufficient documentation

## 2010-11-22 ENCOUNTER — Encounter: Payer: Federal, State, Local not specified - PPO | Admitting: Occupational Therapy

## 2010-11-24 ENCOUNTER — Encounter: Payer: Federal, State, Local not specified - PPO | Admitting: Occupational Therapy

## 2011-01-18 ENCOUNTER — Other Ambulatory Visit: Payer: Self-pay | Admitting: Orthopedic Surgery

## 2011-01-18 ENCOUNTER — Other Ambulatory Visit (HOSPITAL_COMMUNITY): Payer: Self-pay | Admitting: Orthopedic Surgery

## 2011-01-18 ENCOUNTER — Ambulatory Visit (HOSPITAL_COMMUNITY)
Admission: RE | Admit: 2011-01-18 | Discharge: 2011-01-18 | Disposition: A | Discharge status: Home / Self Care | Payer: Federal, State, Local not specified - PPO | Source: Ambulatory Visit | Attending: Orthopedic Surgery | Admitting: Orthopedic Surgery

## 2011-01-18 ENCOUNTER — Encounter (HOSPITAL_COMMUNITY): Payer: Federal, State, Local not specified - PPO

## 2011-01-18 DIAGNOSIS — Z01812 Encounter for preprocedural laboratory examination: Secondary | ICD-10-CM | POA: Insufficient documentation

## 2011-01-18 DIAGNOSIS — Z01811 Encounter for preprocedural respiratory examination: Secondary | ICD-10-CM | POA: Insufficient documentation

## 2011-01-18 DIAGNOSIS — Z01818 Encounter for other preprocedural examination: Secondary | ICD-10-CM | POA: Insufficient documentation

## 2011-01-18 LAB — ABO/RH: ABO/RH(D): B POS

## 2011-01-18 LAB — COMPREHENSIVE METABOLIC PANEL
ALT: 9 U/L (ref 0–35)
Albumin: 3.9 g/dL (ref 3.5–5.2)
Alkaline Phosphatase: 64 U/L (ref 39–117)
Potassium: 4.3 mEq/L (ref 3.5–5.1)
Sodium: 139 mEq/L (ref 135–145)
Total Protein: 7.6 g/dL (ref 6.0–8.3)

## 2011-01-18 LAB — URINALYSIS, ROUTINE W REFLEX MICROSCOPIC
Glucose, UA: NEGATIVE mg/dL
Hgb urine dipstick: NEGATIVE
Protein, ur: NEGATIVE mg/dL

## 2011-01-18 LAB — CBC
Hemoglobin: 10.6 g/dL — ABNORMAL LOW (ref 12.0–15.0)
MCH: 35.7 pg — ABNORMAL HIGH (ref 26.0–34.0)
MCHC: 33.8 g/dL (ref 30.0–36.0)
RDW: 15.7 % — ABNORMAL HIGH (ref 11.5–15.5)

## 2011-01-18 LAB — DIFFERENTIAL
Basophils Relative: 0 % (ref 0–1)
Eosinophils Absolute: 0.1 10*3/uL (ref 0.0–0.7)
Eosinophils Relative: 1 % (ref 0–5)
Monocytes Absolute: 0.5 10*3/uL (ref 0.1–1.0)
Monocytes Relative: 6 % (ref 3–12)
Neutro Abs: 6.1 10*3/uL (ref 1.7–7.7)

## 2011-01-18 LAB — URINE MICROSCOPIC-ADD ON

## 2011-01-18 LAB — PROTIME-INR: Prothrombin Time: 13.4 seconds (ref 11.6–15.2)

## 2011-01-23 ENCOUNTER — Other Ambulatory Visit (HOSPITAL_COMMUNITY): Payer: Self-pay | Admitting: *Deleted

## 2011-01-23 ENCOUNTER — Ambulatory Visit (HOSPITAL_COMMUNITY)
Admission: RE | Admit: 2011-01-23 | Discharge: 2011-01-23 | Disposition: A | Discharge status: Home / Self Care | Payer: Federal, State, Local not specified - PPO | Source: Ambulatory Visit | Attending: Internal Medicine | Admitting: Internal Medicine

## 2011-01-23 DIAGNOSIS — M47812 Spondylosis without myelopathy or radiculopathy, cervical region: Secondary | ICD-10-CM | POA: Insufficient documentation

## 2011-01-23 DIAGNOSIS — Z01818 Encounter for other preprocedural examination: Secondary | ICD-10-CM | POA: Insufficient documentation

## 2011-01-23 DIAGNOSIS — R52 Pain, unspecified: Secondary | ICD-10-CM

## 2011-01-27 ENCOUNTER — Inpatient Hospital Stay (HOSPITAL_COMMUNITY)
Admission: RE | Admit: 2011-01-27 | Discharge: 2011-01-29 | DRG: 470 | Disposition: A | Discharge status: Home Health | Payer: Medicare Other | Source: Ambulatory Visit | Attending: Orthopedic Surgery | Admitting: Orthopedic Surgery

## 2011-01-27 ENCOUNTER — Inpatient Hospital Stay (HOSPITAL_COMMUNITY): Payer: Medicare Other

## 2011-01-27 DIAGNOSIS — M069 Rheumatoid arthritis, unspecified: Principal | ICD-10-CM | POA: Diagnosis present

## 2011-01-27 DIAGNOSIS — I69998 Other sequelae following unspecified cerebrovascular disease: Secondary | ICD-10-CM

## 2011-01-27 DIAGNOSIS — Z01812 Encounter for preprocedural laboratory examination: Secondary | ICD-10-CM

## 2011-01-27 DIAGNOSIS — J449 Chronic obstructive pulmonary disease, unspecified: Secondary | ICD-10-CM | POA: Diagnosis present

## 2011-01-27 DIAGNOSIS — F17200 Nicotine dependence, unspecified, uncomplicated: Secondary | ICD-10-CM | POA: Diagnosis present

## 2011-01-27 DIAGNOSIS — I69928 Other speech and language deficits following unspecified cerebrovascular disease: Secondary | ICD-10-CM

## 2011-01-27 DIAGNOSIS — Z8543 Personal history of malignant neoplasm of ovary: Secondary | ICD-10-CM

## 2011-01-27 DIAGNOSIS — E785 Hyperlipidemia, unspecified: Secondary | ICD-10-CM | POA: Diagnosis present

## 2011-01-27 DIAGNOSIS — R29898 Other symptoms and signs involving the musculoskeletal system: Secondary | ICD-10-CM | POA: Diagnosis present

## 2011-01-27 DIAGNOSIS — J4489 Other specified chronic obstructive pulmonary disease: Secondary | ICD-10-CM | POA: Diagnosis present

## 2011-01-27 DIAGNOSIS — F341 Dysthymic disorder: Secondary | ICD-10-CM | POA: Diagnosis present

## 2011-01-29 LAB — PROTIME-INR: INR: 1.74 — ABNORMAL HIGH (ref 0.00–1.49)

## 2011-01-31 LAB — TYPE AND SCREEN
ABO/RH(D): B POS
Antibody Screen: NEGATIVE
Unit division: 0
Unit division: 0
Unit division: 0
Unit division: 0

## 2011-01-31 NOTE — Op Note (Signed)
NAMEMERCADES, BAJAJ NO.:  [id number]  MEDICAL RECORD NO.:  [id number]  LOCATION:  1613                         FACILITY:  [facility]  PHYSICIAN:  Georges Lynch. Johany Hansman, M.D.DATE OF BIRTH:  [birth year]  DATE OF PROCEDURE: DATE OF DISCHARGE:                              OPERATIVE REPORT   PREOPERATIVE DIAGNOSIS:  Rheumatoid arthritis involving the right hip.  POSTOPERATIVE DIAGNOSIS:  Rheumatoid arthritis involving the right hip.  SURGEON:  Georges Lynch. [name], M.D.  ASSISTANT:  Madlyn Frankel. Charlann Boxer, M.D. Jaquelyn Bitter. Chabon, P.A.  OPERATION:  Right total hip arthroplasty utilizing DePuy system.  I utilized a pinnacle cup, a size 54 mm cup with an AltrX liner.  The liner has a +4 neutral 36-mm inside diameter.  The head was a +5, 36 mm diameter ceramic head.  The femoral stem was a size 9 standard trial lock stem.  We utilized 2 cancellus screws, one was a 35 mm screw and the other was a 30 mm screw.  We also used a hole eliminator.  PROCEDURE:  Under general anesthesia with the patient on left side, right side up, she had 2 g of IV Ancef preop.  At this time, the appropriate time-out was carried out before any incision was made.  The proper right leg was marked in the holding area preop as well.  After time-out, sterile prepping and draping using posterolateral approach to the hip was carried out on the right side, we identified and cauterized. Self-retaining tractors were inserted.  I went down and identified the iliotibial band and this time an incision was made in the band and advanced proximally.  At this time, I then partially detached the external rotators.  Note, because of her rheumatoid arthritis and long- term steroids she did have a sufficient amount of bleeding.  There was no major bleeding, but just gradual oozing in the wound.  We took a great deal of time to cauterize any remaining small bleeders we could at this point.  We then did a capsulectomy,  dislocated the head, amputated the femoral head to the appropriate neck length.  I then utilized my box osteotome to remove the cancellus bone from the lateral greater trochanteric region followed by the widening reamer, then the canal finder, thoroughly irrigated out the canal with antibiotic solution.  At this time, we rasped the canal up to a size 9 and then utilized the calcar reamer even out the femoral neck.  We then directed attention to the acetabulum and completed a capsulectomy, reamed the acetabulum up for a 54-mm cup.  We then inserted a permanent 54-mm cup with 2 screws for fixation.  We then inserted our trial liner, went through trials and finally selected a +4 neutral AltrX head that we inserted after we inserted the hole eliminator.  Following that, we then went back and inserted our permanent trial lock stem size 9.  We then went through various neck length measurements +1.5 and a 5, we chose the +5 as the only stable construct.  We utilized a +5 ceramic head, cleared the acetabulum, reduced the hip, took the hip through motion.  Note, we first went through trials as  I mentioned.  Each time we checked her leg lengths and stability, so we finally used the ceramic head on an AltrX polyethylene liner with good stability, good leg lengths.  We then thoroughly irrigated out the hip.  I injected 10 mL of FloSeal into the soft tissue structures causing gradual oozing of blood.  We also inserted some thrombin-soaked Gelfoam in the subacetabular space.  The wound was closed in layers in the usual fashion.  Skin was closed with metal staples and sterile Neosporin dressing was applied.  She was given 2 units of packed cells gradually in surgery, by the end of surgery she had a hemoglobin of 10.9.  She lost approximately about 900 mL of blood during the procedure.          ______________________________ Georges Lynch. [name], M.D.     RAG/MEDQ  D:  01/27/2011  T:  01/27/2011   Job:  [id number]  cc:   Louann Liv MD [facility] Department of Rheumatology [facility]  Electronically Signed by Ranee Gosselin M.D. on 01/31/2011 07:49:35 AM

## 2011-01-31 NOTE — H&P (Signed)
  NAME:  Anne Stevens, Anne Stevens                 ACCOUNT NO.:  [id number]  MEDICAL RECORD NO.:  [id number]  LOCATION:                                 FACILITY:  PHYSICIAN:  Georges Lynch. Shanon Becvar, M.D.DATE OF BIRTH:  1960/09/03  DATE OF ADMISSION:  01/27/2011 DATE OF DISCHARGE:                             HISTORY & PHYSICAL   CHIEF COMPLAINT:  Right hip pain.  HISTORY OF PRESENT ILLNESS:  Patient is a 50 year old female, worsening right hip pain secondary to end-stage rheumatoid arthritis to that hip. Patient elected to have a total hip arthroplasty, decreased pain, and increased function.  PAST MEDICAL HISTORY: 1. Rheumatoid arthritis. 2. Ovarian cancer, status post chemotherapy. 3. History of a CVA. 4. Hyperlipidemia. 5. Depression. 6. Anxiety.  FAMILY MEDICAL HISTORY:  Negative.  SOCIAL HISTORY:  Patient does smoke, occasional alcohol use.  Patient of Dr. Buzzy Han and also Dr. Elesa Massed at [facility].  DRUG ALLERGIES:  None.  CURRENT MEDICATIONS: 1. Prednisone 5 mg daily. 2. Aspirin 81 mg daily. 3. Effexor 300 mg daily. 4. Topiramate 50 mg q.h.s. 5. Advair. 6. Crestor. 7. Imuran.  REVIEW OF SYSTEMS:  Shows pain on range of motion and ambulation. 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  PHYSICAL EXAMINATION:  VITAL SIGNS:  Pulse 64, respirations 16, blood pressure 108/70. GENERAL:  Patient is alert and appropriate 49 year old female, in no acute distress.  Pleasant mood and affect here in the office today. MUSCULOSKELETAL:  Examination of the cervical spine shows no tenderness to palpation.  Paraspinal muscles and spinous process show good range of motion without any difficulty.  Cranial nerves II through XII grossly intact. CHEST:  Has active breath sounds bilaterally.  No wheezes, rhonchi, or rales. HEART:  Shows regular rate and rhythm.  No murmur. ABDOMEN:  Nontender, nondistended. EXTREMITIES:  Show some moderate tenderness at the right hip with internal and external  rotation.  She does have a painful and antalgic gait.  She also has decreased sensation to the right upper extremity secondary to CVA, but her strength is 5/5 in the bilateral upper and lower extremities.  She has no rashes.  She does have an antalgic gait as stated before.  X-rays show end-stage arthritis to the right hip.  IMPRESSION:  End-stage arthritis of the right hip secondary to prior rheumatoid.  PLAN OF ACTION:  To have a right total hip arthroplasty by Dr. Ranee Gosselin.     Thomas B. Dixon, P.A.   ______________________________ Georges Lynch Anne Stevens, M.D.    TBD/MEDQ  D:  01/17/2011  T:  01/18/2011  Job:  [id number]  Electronically Signed by Standley Dakins P.A. on 01/24/2011 10:41:17 AM Electronically Signed by Ranee Gosselin M.D. on 01/31/2011 07:49:31 AM

## 2011-02-27 NOTE — Discharge Summary (Signed)
  NAME:  Anne Stevens, Anne Stevens                 ACCOUNT NO.:  [id number]  MEDICAL RECORD NO.:  [id number]  LOCATION:                                 FACILITY:  PHYSICIAN:  Georges Lynch. Jilliann Subramanian, M.D.DATE OF BIRTH:  [birth year]  DATE OF ADMISSION:  01/27/2011 DATE OF DISCHARGE:  01/29/2011                              DISCHARGE SUMMARY   She was taken to Surgery by me at which time I did a right total hip arthroplasty that was done on January 29, 2011.  Postop, the x-ray of her hip that was taken in the recovery room showed right hip replacement without any complicating features and that x-ray was taken in the recovery room on January 27, 2011.  Following that, she was followed in the hospital daily on rounds.  She was evaluated by the social worker as well on August 31.  On January 28, 2011, she was doing well, hemoglobin 9.5, hematocrit 28.5.  Her dressing looked fine.  She was receiving physical therapy at that time.  On September 2, there were no major issues.  She was doing very well.  Her hemoglobin dropped to 8.3 and hematocrit 24.6.  Dressing was intact.  The plan was to discharge her soon on that date on January 29, 2011.  She was up with physical therapy during the hospital course with partial weightbearing.  On September 2, her INR was 1.74.  The hemoglobin was 8.3.  She was maintained on a Coumadin heparin protocol while in the hospital.  She was given the appropriate discharge instructions.  DISCHARGE MEDICATIONS:  Were all listed on the discharge manager.  LAB DATA:  On August 31, her hemoglobin was 10.9, hematocrit 32.  On September 1, her hemoglobin was 9.5, hematocrit 28.  On September 2, her hemoglobin was 8.3, hematocrit 24.  Her INR on September 1 was 1.13, a PT was 14.7.  On September 2, her PT was 20.7, her INR 1.74.  DISCHARGE DIAGNOSIS:  Degenerative arthritis of the right hip.  DISCHARGE CONDITION:  Improved.  DISCHARGE DIET:  She remained on her preop  diet.  DISCHARGE INSTRUCTIONS: 1. Home.  She will be followed by the home healthcare. 2. She will ambulate partial weightbearing, 50% weightbearing with the     walker. 3. She will change her dressing daily.  She will have the home health     agency give her therapy at home and     manage her INR.  She will be discharged on the Coumadin obviously.     She also will see me in the office 2 weeks from the date of     surgery.  DISCHARGE CONDITION:  Improved.          ______________________________ Georges Lynch [name], M.D.     RAG/MEDQ  D:  02/18/2011  T:  02/18/2011  Job:  [id number]  Electronically Signed by Ranee Gosselin M.D. on 02/27/2011 08:12:18 PM

## 2012-01-22 ENCOUNTER — Encounter (HOSPITAL_COMMUNITY): Payer: Self-pay | Admitting: Pharmacy Technician

## 2012-01-30 NOTE — Patient Instructions (Signed)
20 Anne Stevens  01/30/2012   Your procedure is scheduled on:  02/05/12 0730am-0930am  Report to [facility] at 0530 AM.  Call this number if you have problems the morning of surgery: [phone]   Remember:   Do not eat food:After Midnight.  May have clear liquids:until Midnight .    Take these medicines the morning of surgery with A SIP OF WATER:   Do not wear jewelry, make-up or nail polish.  Do not wear lotions, powders, or perfumes.   Do not shave 48 hours prior to surgery..  Do not bring valuables to the hospital.  Contacts, dentures or bridgework may not be worn into surgery.  Leave suitcase in the car. After surgery it may be brought to your room.  For patients admitted to the hospital, checkout time is 11:00 AM the day of discharge.   Special Instructions: CHG Shower Use Special Wash: 1/2 bottle night before surgery and 1/2 bottle morning of surgery.   Please read over the following fact sheets that you were given: MRSA Information, Incentive Spirometry Fact Sheet, Blood Transfusion Fact Sheet, coughing and deep breathing exercises, leg exercises

## 2012-01-31 ENCOUNTER — Encounter (HOSPITAL_COMMUNITY)
Admission: RE | Admit: 2012-01-31 | Discharge: 2012-01-31 | Disposition: A | Discharge status: Home / Self Care | Payer: Medicare Other | Source: Ambulatory Visit | Attending: Orthopedic Surgery | Admitting: Orthopedic Surgery

## 2012-01-31 ENCOUNTER — Encounter (HOSPITAL_COMMUNITY): Payer: Self-pay

## 2012-01-31 HISTORY — DX: Anxiety disorder, unspecified: F41.9

## 2012-01-31 HISTORY — DX: Anemia, unspecified: D64.9

## 2012-01-31 HISTORY — DX: Unspecified asthma, uncomplicated: J45.909

## 2012-01-31 HISTORY — DX: Cerebral infarction, unspecified: I63.9

## 2012-01-31 HISTORY — DX: Cardiac murmur, unspecified: R01.1

## 2012-01-31 HISTORY — DX: Myoneural disorder, unspecified: G70.9

## 2012-01-31 LAB — PROTIME-INR
INR: 1.07 (ref 0.00–1.49)
Prothrombin Time: 14.1 seconds (ref 11.6–15.2)

## 2012-01-31 LAB — SURGICAL PCR SCREEN
MRSA, PCR: POSITIVE — AB
Staphylococcus aureus: POSITIVE — AB

## 2012-01-31 LAB — APTT: aPTT: 39 seconds — ABNORMAL HIGH (ref 24–37)

## 2012-01-31 NOTE — Progress Notes (Signed)
Dr  Louann Liv - rheumatologist- [phone] at [facility]  Dr Noland Fordyce  Oncologist at [facility]  PCP- Dr Judson Roch in [place]- [phone] Stroke followed by Dr Pearlean Brownie in [facility] 01/18/12 CBC.DIFF, CMET, Urinalysis- 01/18/12 results on chart  CT of chest, abdomen, and pelvis done 01/18/12 on chart

## 2012-02-01 ENCOUNTER — Other Ambulatory Visit (HOSPITAL_COMMUNITY): Payer: Self-pay | Admitting: Internal Medicine

## 2012-02-01 ENCOUNTER — Ambulatory Visit (HOSPITAL_COMMUNITY)
Admission: RE | Admit: 2012-02-01 | Discharge: 2012-02-01 | Disposition: A | Discharge status: Home / Self Care | Payer: Medicare Other | Source: Ambulatory Visit | Attending: Internal Medicine | Admitting: Internal Medicine

## 2012-02-01 DIAGNOSIS — Z7982 Long term (current) use of aspirin: Secondary | ICD-10-CM | POA: Insufficient documentation

## 2012-02-01 DIAGNOSIS — M810 Age-related osteoporosis without current pathological fracture: Secondary | ICD-10-CM | POA: Insufficient documentation

## 2012-02-01 DIAGNOSIS — Z96649 Presence of unspecified artificial hip joint: Secondary | ICD-10-CM | POA: Insufficient documentation

## 2012-02-01 DIAGNOSIS — M069 Rheumatoid arthritis, unspecified: Secondary | ICD-10-CM

## 2012-02-01 DIAGNOSIS — Z01812 Encounter for preprocedural laboratory examination: Secondary | ICD-10-CM | POA: Insufficient documentation

## 2012-02-01 DIAGNOSIS — Z01810 Encounter for preprocedural cardiovascular examination: Secondary | ICD-10-CM | POA: Insufficient documentation

## 2012-02-01 DIAGNOSIS — M87059 Idiopathic aseptic necrosis of unspecified femur: Secondary | ICD-10-CM | POA: Insufficient documentation

## 2012-02-01 DIAGNOSIS — Z8673 Personal history of transient ischemic attack (TIA), and cerebral infarction without residual deficits: Secondary | ICD-10-CM | POA: Insufficient documentation

## 2012-02-01 DIAGNOSIS — Z79899 Other long term (current) drug therapy: Secondary | ICD-10-CM | POA: Insufficient documentation

## 2012-02-01 DIAGNOSIS — Z8543 Personal history of malignant neoplasm of ovary: Secondary | ICD-10-CM | POA: Insufficient documentation

## 2012-02-01 DIAGNOSIS — M5030 Other cervical disc degeneration, unspecified cervical region: Secondary | ICD-10-CM | POA: Insufficient documentation

## 2012-02-01 DIAGNOSIS — Z01818 Encounter for other preprocedural examination: Secondary | ICD-10-CM | POA: Insufficient documentation

## 2012-02-01 DIAGNOSIS — Z90710 Acquired absence of both cervix and uterus: Secondary | ICD-10-CM | POA: Insufficient documentation

## 2012-02-01 NOTE — H&P (Signed)
Anne Stevens DOB: June 12, 1960  Chief Complaint: left hip pain   History of Present Illness The patient is a 51 year old female who comes in today for a preoperative History and Physical. The patient is scheduled for a left total hip arthroplasty to be performed by Dr. Georges Lynch. Anne Hillock, MD at [facility] on Monday February 05, 2012 . She has been followed for her left hip pain for several months. She has increased pain in the left hip. It's really quite shocking to go back and look at her x-rays from 2012 and Oct 10, 2011 and compare those with the x-rays now. 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 She has had intra-articular injections with minimal relief. She has a right total hip arthroplasty done in September of 2012. Most predictable means for increased function and decreased pain in the left hip is a left total hip arthroplasty.    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 05/17/2010 Cancer. ovarian Migraine Headache Anxiety/Depression Bronchitis Varicose veins Hemorrhoids Rheumatoid Arthritis Osteoporosis Degenerative Disc Disease  Allergies No Known Drug Allergies.    Family History Cancer. father deceased ate age 28 from lung cancer Diabetes Mellitus. brother Heart Disease. brother Stroke. mother deceased age 26 due to stroke   Social History No alcohol use Tobacco use.Smokes 1 pack daily   Medication History Robaxin (500MG  Tablet, 1 (one) Oral three times daily as needed for spasm,  Active. ClonazePAM (1MG  Tablet, Oral two times daily) Active. Crestor (10MG  Tablet, Oral daily) Active. Topiramate (50MG  Tablet, Oral) Active. (50 qam, 100 qhs) Abilify (2MG  Tablet, Oral daily) Active. Aspirin EC (325MG  Tablet DR, Oral daily) Active. Calcium-Vitamin D (500MG  Capsule, Oral daily) Active. Percocet (10-325MG  Tablet, Oral four times daily, as needed)  Active. PredniSONE (5MG  Tablet, Oral) Active. (3 tablets qam x 3 weeks; 2 tablets qam x 2 weeks; 1.5 tablets qam x 2 weeks; 1 tablets daily for 2 weeks) Effexor XR (150MG  Capsule ER 24HR, 2 tablets Oral daily) Active. Premarin (0.625MG /GM Cream, Vaginal two times a week) Active. Docusate Sodium (100MG  Tablet, Oral two times daily) Active. One Daily Multiple Vitamin ( Oral daily) Active. Glucosamine Complex ( Oral daily) Active. Vitamin C ( Oral daily) Active.   Past Surgical History Hysterectomy (due to cancer) - Complete Elbow Surgery. radial head fracture repiar Total Hip Replacement - Right    Review of Systems General:Present- Fatigue. Not Present- Chills, Fever, Night Sweats, Weight Gain, Weight Loss and Memory Loss. Skin:Not Present- Hives, Itching, Rash, Eczema and Lesions. HEENT:Not Present- Tinnitus, Headache, Double Vision, Visual Loss, Hearing Loss and Dentures. Respiratory:Not Present- Shortness of breath with exertion, Shortness of breath at rest, Allergies, Coughing up blood and Chronic Cough. Cardiovascular:Not Present- Chest Pain, Racing/skipping heartbeats, Difficulty Breathing Lying Down, Murmur, Swelling and Palpitations. Gastrointestinal:Not Present- Bloody Stool, Heartburn, Abdominal Pain, Vomiting, Nausea, Constipation, Diarrhea, Difficulty Swallowing, Jaundice and Loss of appetitie. Female Genitourinary:Not Present- Blood in Urine, Urinary frequency, Weak urinary stream, Discharge, Flank Pain, Incontinence, Painful Urination, Urgency, Urinary Retention and Urinating at Night. Musculoskeletal:Present- Joint Swelling, Joint Pain and Morning Stiffness. Not Present- Muscle Weakness, Muscle Pain, Back Pain and Spasms. Neurological:Not Present- Tremor, Dizziness, Blackout spells, Paralysis, Difficulty with balance and Weakness. Psychiatric:Not Present- Insomnia.   Vitals Weight: 166 lb Height: 69.5 in Body Surface Area: 1.92 m Body Mass Index:  24.16 kg/m Pulse: 98 (Regular) Resp.: 16 (Unlabored) BP: 100/76 (Sitting, Left Arm, Standard)    Physical Exam General Mental Status -  Alert, cooperative and good historian. Speech- Slurred. General Appearance- pleasant. Not in acute distress. Orientation- Oriented X3. Build & Nutrition- Well nourished and Well developed. Head and Neck Head- normocephalic, atraumatic . Neck Global Assessment- supple. no bruit auscultated on the right and no bruit auscultated on the left. Eye Pupil- Bilateral- Regular and Round. Motion- Bilateral- EOMI. ENMT Mouth and Throat Oral Cavity/Oropharynx:Teeth- discolored Chest and Lung Exam Auscultation: Breath sounds:- clear at anterior chest wall and - clear at posterior chest wall. Adventitious sounds:- No Adventitious sounds. Cardiovascular Auscultation:Rhythm- Regular rate and rhythm. Heart Sounds- S1 WNL and S2 WNL. Murmurs & Other Heart Sounds:Auscultation of the heart reveals - No Murmurs. Abdomen Palpation/Percussion:Tenderness- Abdomen is non-tender to palpation. Rigidity (guarding)- Abdomen is soft. Auscultation:Auscultation of the abdomen reveals - Bowel sounds normal. Female Genitourinary Not done, not pertinent to present illness Peripheral Vascular Upper Extremity: Palpation:- Pulses bilaterally normal. Lower Extremity: Palpation:- Pulses bilaterally normal. Neurologic Examination of related systems reveals - normal muscle strength and tone in all extremities. Sensory:Paresthesia- Right hand. Light Touch:Decreased- Right hand. Musculoskeletal Exam of her right total hip is functioning well. No pain. Leg lengths are equal. The left hip is painful with all ranges. She has quite a bit of pain over the greater trochanteric bursa, left hip. No masses, no tumors though. She has no restricted motion of the left hip. Gait: She ambulates without support.    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 Nolon Stalls Neurology:Dr. Pearlean Brownie Oncology:Dr. Noland Fordyce

## 2012-02-05 ENCOUNTER — Encounter (HOSPITAL_COMMUNITY): Payer: Self-pay | Admitting: Anesthesiology

## 2012-02-05 ENCOUNTER — Ambulatory Visit (HOSPITAL_COMMUNITY): Payer: Medicare Other | Admitting: Anesthesiology

## 2012-02-05 ENCOUNTER — Inpatient Hospital Stay (HOSPITAL_COMMUNITY)
Admission: RE | Admit: 2012-02-05 | Discharge: 2012-02-08 | DRG: 470 | Disposition: A | Discharge status: Home Health | Payer: Medicare Other | Source: Ambulatory Visit | Attending: Orthopedic Surgery | Admitting: Orthopedic Surgery

## 2012-02-05 ENCOUNTER — Encounter (HOSPITAL_COMMUNITY)
Admission: RE | Disposition: A | Discharge status: Home Health | Payer: Self-pay | Source: Ambulatory Visit | Attending: Orthopedic Surgery

## 2012-02-05 ENCOUNTER — Ambulatory Visit (HOSPITAL_COMMUNITY): Payer: Medicare Other

## 2012-02-05 ENCOUNTER — Encounter (HOSPITAL_COMMUNITY): Payer: Self-pay | Admitting: *Deleted

## 2012-02-05 ENCOUNTER — Encounter (HOSPITAL_COMMUNITY): Payer: Self-pay | Admitting: Orthopedic Surgery

## 2012-02-05 DIAGNOSIS — R209 Unspecified disturbances of skin sensation: Secondary | ICD-10-CM | POA: Diagnosis present

## 2012-02-05 DIAGNOSIS — F17200 Nicotine dependence, unspecified, uncomplicated: Secondary | ICD-10-CM | POA: Diagnosis present

## 2012-02-05 DIAGNOSIS — D62 Acute posthemorrhagic anemia: Secondary | ICD-10-CM

## 2012-02-05 DIAGNOSIS — M87059 Idiopathic aseptic necrosis of unspecified femur: Principal | ICD-10-CM | POA: Diagnosis present

## 2012-02-05 DIAGNOSIS — I69998 Other sequelae following unspecified cerebrovascular disease: Secondary | ICD-10-CM

## 2012-02-05 DIAGNOSIS — Z96649 Presence of unspecified artificial hip joint: Secondary | ICD-10-CM

## 2012-02-05 DIAGNOSIS — M8970 Major osseous defect, unspecified site: Secondary | ICD-10-CM | POA: Diagnosis present

## 2012-02-05 DIAGNOSIS — Z01812 Encounter for preprocedural laboratory examination: Secondary | ICD-10-CM

## 2012-02-05 HISTORY — PX: TOTAL HIP ARTHROPLASTY: SHX124

## 2012-02-05 SURGERY — ARTHROPLASTY, HIP, TOTAL,POSTERIOR APPROACH
Anesthesia: General | Site: Hip | Laterality: Left | Wound class: Clean

## 2012-02-05 MED ORDER — ESTROGENS, CONJUGATED 0.625 MG/GM VA CREA: 0.2500 | TOPICAL_CREAM | VAGINAL | Status: DC

## 2012-02-05 MED ORDER — VITAMIN C 500 MG PO TABS
1000.0000 mg | ORAL_TABLET | Freq: Every day | ORAL | Status: DC
  Administered 2012-02-05 – 2012-02-08 (×4): 1000 mg via ORAL
  Filled 2012-02-05 (×4): qty 2

## 2012-02-05 MED ORDER — ARIPIPRAZOLE 2 MG PO TABS
2.0000 mg | ORAL_TABLET | Freq: Every day | ORAL | Status: DC
  Administered 2012-02-06 – 2012-02-08 (×3): 2 mg via ORAL
  Filled 2012-02-05 (×4): qty 1

## 2012-02-05 MED ORDER — ACETAMINOPHEN 325 MG PO TABS: 650.0000 mg | ORAL_TABLET | Freq: Four times a day (QID) | ORAL | Status: DC | PRN

## 2012-02-05 MED ORDER — VANCOMYCIN HCL IN DEXTROSE 1-5 GM/200ML-% IV SOLN
1000.0000 mg | Freq: Two times a day (BID) | INTRAVENOUS | Status: AC
  Administered 2012-02-05: 1000 mg via INTRAVENOUS
  Filled 2012-02-05: qty 200

## 2012-02-05 MED ORDER — HYDROMORPHONE HCL PF 1 MG/ML IJ SOLN
INTRAMUSCULAR | Status: DC | PRN
  Administered 2012-02-05 (×2): 0.5 mg via INTRAVENOUS

## 2012-02-05 MED ORDER — CLONAZEPAM 1 MG PO TABS
1.0000 mg | ORAL_TABLET | Freq: Two times a day (BID) | ORAL | Status: DC | PRN
  Administered 2012-02-05 – 2012-02-08 (×6): 1 mg via ORAL
  Filled 2012-02-05 (×6): qty 1

## 2012-02-05 MED ORDER — HYDROMORPHONE HCL PF 1 MG/ML IJ SOLN
INTRAMUSCULAR | Status: AC
  Filled 2012-02-05: qty 1

## 2012-02-05 MED ORDER — NEOSTIGMINE METHYLSULFATE 1 MG/ML IJ SOLN
INTRAMUSCULAR | Status: DC | PRN
  Administered 2012-02-05: 3 mg via INTRAVENOUS

## 2012-02-05 MED ORDER — ONDANSETRON HCL 4 MG/2ML IJ SOLN
INTRAMUSCULAR | Status: DC | PRN
  Administered 2012-02-05: 4 mg via INTRAVENOUS

## 2012-02-05 MED ORDER — BACITRACIN ZINC 500 UNIT/GM EX OINT
TOPICAL_OINTMENT | CUTANEOUS | Status: AC
  Filled 2012-02-05: qty 15

## 2012-02-05 MED ORDER — METHOCARBAMOL 500 MG PO TABS
500.0000 mg | ORAL_TABLET | Freq: Four times a day (QID) | ORAL | Status: DC | PRN
  Administered 2012-02-05 – 2012-02-08 (×7): 500 mg via ORAL
  Filled 2012-02-05 (×7): qty 1

## 2012-02-05 MED ORDER — TOPIRAMATE 100 MG PO TABS
100.0000 mg | ORAL_TABLET | Freq: Every day | ORAL | Status: DC
  Administered 2012-02-05 – 2012-02-07 (×3): 100 mg via ORAL
  Filled 2012-02-05 (×4): qty 1

## 2012-02-05 MED ORDER — RIVAROXABAN 10 MG PO TABS
10.0000 mg | ORAL_TABLET | Freq: Every day | ORAL | Status: DC
  Administered 2012-02-06 – 2012-02-08 (×3): 10 mg via ORAL
  Filled 2012-02-05 (×4): qty 1

## 2012-02-05 MED ORDER — ONDANSETRON HCL 4 MG/2ML IJ SOLN: 4.0000 mg | Freq: Four times a day (QID) | INTRAMUSCULAR | Status: DC | PRN

## 2012-02-05 MED ORDER — HYDROMORPHONE HCL PF 1 MG/ML IJ SOLN
1.0000 mg | INTRAMUSCULAR | Status: DC | PRN
  Administered 2012-02-05 – 2012-02-07 (×5): 1 mg via INTRAVENOUS
  Filled 2012-02-05 (×5): qty 1

## 2012-02-05 MED ORDER — CEFAZOLIN SODIUM-DEXTROSE 2-3 GM-% IV SOLR
INTRAVENOUS | Status: AC
  Filled 2012-02-05: qty 50

## 2012-02-05 MED ORDER — DEXTROSE 5 % IV SOLN
500.0000 mg | Freq: Four times a day (QID) | INTRAVENOUS | Status: DC | PRN
  Administered 2012-02-05 – 2012-02-08 (×2): 500 mg via INTRAVENOUS
  Filled 2012-02-05 (×2): qty 5

## 2012-02-05 MED ORDER — LACTATED RINGERS IV SOLN
INTRAVENOUS | Status: DC
  Administered 2012-02-05: 1000 mL via INTRAVENOUS
  Administered 2012-02-05 (×2): via INTRAVENOUS

## 2012-02-05 MED ORDER — FENTANYL CITRATE 0.05 MG/ML IJ SOLN
INTRAMUSCULAR | Status: DC | PRN
  Administered 2012-02-05: 50 ug via INTRAVENOUS
  Administered 2012-02-05: 100 ug via INTRAVENOUS
  Administered 2012-02-05: 50 ug via INTRAVENOUS

## 2012-02-05 MED ORDER — ONDANSETRON HCL 4 MG PO TABS: 4.0000 mg | ORAL_TABLET | Freq: Four times a day (QID) | ORAL | Status: DC | PRN

## 2012-02-05 MED ORDER — PROPOFOL 10 MG/ML IV BOLUS
INTRAVENOUS | Status: DC | PRN
  Administered 2012-02-05: 170 mg via INTRAVENOUS

## 2012-02-05 MED ORDER — ALUM & MAG HYDROXIDE-SIMETH 200-200-20 MG/5ML PO SUSP: 30.0000 mL | ORAL | Status: DC | PRN

## 2012-02-05 MED ORDER — TOPIRAMATE 25 MG PO TABS
50.0000 mg | ORAL_TABLET | Freq: Every day | ORAL | Status: DC
  Administered 2012-02-06 – 2012-02-08 (×3): 50 mg via ORAL
  Filled 2012-02-05 (×3): qty 2

## 2012-02-05 MED ORDER — BISACODYL 10 MG RE SUPP: 10.0000 mg | Freq: Every day | RECTAL | Status: DC | PRN

## 2012-02-05 MED ORDER — SODIUM CHLORIDE 0.9 % IR SOLN
Status: DC | PRN
  Administered 2012-02-05: 08:00:00

## 2012-02-05 MED ORDER — ESTROGENS, CONJUGATED 0.625 MG/GM VA CREA
0.2500 | TOPICAL_CREAM | VAGINAL | Status: DC
  Filled 2012-02-05: qty 42.5

## 2012-02-05 MED ORDER — PROMETHAZINE HCL 25 MG/ML IJ SOLN: 6.2500 mg | INTRAMUSCULAR | Status: DC | PRN

## 2012-02-05 MED ORDER — HYDROCODONE-ACETAMINOPHEN 5-325 MG PO TABS
1.0000 | ORAL_TABLET | ORAL | Status: DC | PRN
  Administered 2012-02-05 – 2012-02-08 (×3): 2 via ORAL
  Filled 2012-02-05 (×3): qty 2

## 2012-02-05 MED ORDER — THROMBIN 5000 UNITS EX SOLR
CUTANEOUS | Status: DC | PRN
  Administered 2012-02-05: 5000 [IU] via TOPICAL

## 2012-02-05 MED ORDER — EPHEDRINE SULFATE 50 MG/ML IJ SOLN
INTRAMUSCULAR | Status: DC | PRN
  Administered 2012-02-05 (×2): 5 mg via INTRAVENOUS

## 2012-02-05 MED ORDER — VANCOMYCIN HCL 1000 MG IV SOLR
1000.0000 mg | INTRAVENOUS | Status: DC | PRN
  Administered 2012-02-05: 1000 mg via INTRAVENOUS

## 2012-02-05 MED ORDER — FERROUS SULFATE 325 (65 FE) MG PO TABS
325.0000 mg | ORAL_TABLET | Freq: Three times a day (TID) | ORAL | Status: DC
  Administered 2012-02-05 – 2012-02-08 (×9): 325 mg via ORAL
  Filled 2012-02-05 (×12): qty 1

## 2012-02-05 MED ORDER — BACITRACIN ZINC 500 UNIT/GM EX OINT
TOPICAL_OINTMENT | CUTANEOUS | Status: DC | PRN
  Administered 2012-02-05: 1 via TOPICAL

## 2012-02-05 MED ORDER — HYDROMORPHONE HCL PF 1 MG/ML IJ SOLN
0.2500 mg | INTRAMUSCULAR | Status: DC | PRN
  Administered 2012-02-05: 0.25 mg via INTRAVENOUS
  Administered 2012-02-05: 0.5 mg via INTRAVENOUS
  Administered 2012-02-05: 0.25 mg via INTRAVENOUS
  Administered 2012-02-05: 0.5 mg via INTRAVENOUS

## 2012-02-05 MED ORDER — ACETAMINOPHEN 650 MG RE SUPP: 650.0000 mg | Freq: Four times a day (QID) | RECTAL | Status: DC | PRN

## 2012-02-05 MED ORDER — GLYCOPYRROLATE 0.2 MG/ML IJ SOLN
INTRAMUSCULAR | Status: DC | PRN
  Administered 2012-02-05: 0.4 mg via INTRAVENOUS

## 2012-02-05 MED ORDER — ACETAMINOPHEN 10 MG/ML IV SOLN
INTRAVENOUS | Status: DC | PRN
  Administered 2012-02-05: 1000 mg via INTRAVENOUS

## 2012-02-05 MED ORDER — VANCOMYCIN HCL IN DEXTROSE 1-5 GM/200ML-% IV SOLN
INTRAVENOUS | Status: AC
  Filled 2012-02-05: qty 200

## 2012-02-05 MED ORDER — OXYCODONE-ACETAMINOPHEN 5-325 MG PO TABS
2.0000 | ORAL_TABLET | ORAL | Status: DC | PRN
  Administered 2012-02-06 – 2012-02-08 (×13): 2 via ORAL
  Filled 2012-02-05 (×13): qty 2

## 2012-02-05 MED ORDER — LACTATED RINGERS IV SOLN
INTRAVENOUS | Status: DC
  Administered 2012-02-05 – 2012-02-07 (×4): via INTRAVENOUS

## 2012-02-05 MED ORDER — ROCURONIUM BROMIDE 100 MG/10ML IV SOLN
INTRAVENOUS | Status: DC | PRN
  Administered 2012-02-05: 50 mg via INTRAVENOUS

## 2012-02-05 MED ORDER — LIDOCAINE HCL (CARDIAC) 20 MG/ML IV SOLN
INTRAVENOUS | Status: DC | PRN
  Administered 2012-02-05: 50 mg via INTRAVENOUS

## 2012-02-05 MED ORDER — DEXAMETHASONE SODIUM PHOSPHATE 10 MG/ML IJ SOLN
INTRAMUSCULAR | Status: DC | PRN
  Administered 2012-02-05: 10 mg via INTRAVENOUS

## 2012-02-05 MED ORDER — ACETAMINOPHEN 10 MG/ML IV SOLN
INTRAVENOUS | Status: AC
  Filled 2012-02-05: qty 100

## 2012-02-05 MED ORDER — METOCLOPRAMIDE HCL 5 MG/ML IJ SOLN
INTRAMUSCULAR | Status: DC | PRN
  Administered 2012-02-05: 10 mg via INTRAVENOUS

## 2012-02-05 MED ORDER — VENLAFAXINE HCL ER 150 MG PO CP24
300.0000 mg | ORAL_CAPSULE | Freq: Every day | ORAL | Status: DC
  Administered 2012-02-06 – 2012-02-08 (×3): 300 mg via ORAL
  Filled 2012-02-05 (×3): qty 2

## 2012-02-05 MED ORDER — LABETALOL HCL 5 MG/ML IV SOLN
INTRAVENOUS | Status: DC | PRN
  Administered 2012-02-05: 2.5 mg via INTRAVENOUS

## 2012-02-05 MED ORDER — MIDAZOLAM HCL 5 MG/5ML IJ SOLN
INTRAMUSCULAR | Status: DC | PRN
  Administered 2012-02-05: 2 mg via INTRAVENOUS

## 2012-02-05 MED ORDER — SODIUM CHLORIDE 0.9 % IR SOLN
Status: DC | PRN
  Administered 2012-02-05: 1000 mL

## 2012-02-05 MED ORDER — CEFAZOLIN SODIUM-DEXTROSE 2-3 GM-% IV SOLR: 2.0000 g | INTRAVENOUS | Status: DC

## 2012-02-05 MED ORDER — POLYETHYLENE GLYCOL 3350 17 G PO PACK: 17.0000 g | PACK | Freq: Every day | ORAL | Status: DC | PRN

## 2012-02-05 MED ORDER — ATORVASTATIN CALCIUM 10 MG PO TABS
10.0000 mg | ORAL_TABLET | Freq: Every day | ORAL | Status: DC
  Administered 2012-02-06 – 2012-02-07 (×2): 10 mg via ORAL
  Filled 2012-02-05 (×4): qty 1

## 2012-02-05 MED ORDER — PHENYLEPHRINE HCL 10 MG/ML IJ SOLN
INTRAMUSCULAR | Status: DC | PRN
  Administered 2012-02-05: 80 ug via INTRAVENOUS

## 2012-02-05 MED ORDER — THROMBIN 5000 UNITS EX SOLR
CUTANEOUS | Status: AC
  Filled 2012-02-05: qty 5000

## 2012-02-05 MED ORDER — PREDNISONE 5 MG PO TABS
5.0000 mg | ORAL_TABLET | Freq: Every day | ORAL | Status: DC
  Administered 2012-02-06 – 2012-02-08 (×3): 5 mg via ORAL
  Filled 2012-02-05 (×4): qty 1

## 2012-02-05 MED ORDER — MENTHOL 3 MG MT LOZG: 1.0000 | LOZENGE | OROMUCOSAL | Status: DC | PRN

## 2012-02-05 MED ORDER — BUPIVACAINE LIPOSOME 1.3 % IJ SUSP
20.0000 mL | INTRAMUSCULAR | Status: AC
  Administered 2012-02-05: 20 mL
  Filled 2012-02-05: qty 20

## 2012-02-05 MED ORDER — PHENOL 1.4 % MT LIQD: 1.0000 | OROMUCOSAL | Status: DC | PRN

## 2012-02-05 MED ORDER — AZATHIOPRINE 50 MG PO TABS: 100.0000 mg | ORAL_TABLET | Freq: Every day | ORAL | Status: DC

## 2012-02-05 SURGICAL SUPPLY — 52 items
BAG ZIPLOCK 12X15 (MISCELLANEOUS) ×2 IMPLANT
BLADE SAW SAG 73X25 THK (BLADE) ×1
BLADE SAW SGTL 73X25 THK (BLADE) ×1 IMPLANT
CLOTH BEACON ORANGE TIMEOUT ST (SAFETY) ×2 IMPLANT
DRAPE INCISE IOBAN 66X45 STRL (DRAPES) ×2 IMPLANT
DRAPE INCISE IOBAN 85X60 (DRAPES) ×2 IMPLANT
DRAPE ORTHO SPLIT 77X108 STRL (DRAPES) ×2
DRAPE POUCH INSTRU U-SHP 10X18 (DRAPES) ×2 IMPLANT
DRAPE SURG 17X11 SM STRL (DRAPES) ×2 IMPLANT
DRAPE SURG ORHT 6 SPLT 77X108 (DRAPES) ×2 IMPLANT
DRAPE U-SHAPE 47X51 STRL (DRAPES) ×2 IMPLANT
DRSG ADAPTIC 3X8 NADH LF (GAUZE/BANDAGES/DRESSINGS) ×2 IMPLANT
DRSG EMULSION OIL 3X16 NADH (GAUZE/BANDAGES/DRESSINGS) ×2 IMPLANT
DRSG PAD ABDOMINAL 8X10 ST (GAUZE/BANDAGES/DRESSINGS) ×2 IMPLANT
DURAPREP 26ML APPLICATOR (WOUND CARE) ×2 IMPLANT
ELECT BLADE TIP CTD 4 INCH (ELECTRODE) ×2 IMPLANT
ELECT REM PT RETURN 9FT ADLT (ELECTROSURGICAL) ×2
ELECTRODE REM PT RTRN 9FT ADLT (ELECTROSURGICAL) ×1 IMPLANT
EVACUATOR 1/8 PVC DRAIN (DRAIN) ×2 IMPLANT
FACESHIELD LNG OPTICON STERILE (SAFETY) ×10 IMPLANT
FLOSEAL 10ML (HEMOSTASIS) ×2 IMPLANT
GLOVE BIOGEL M 6.5 STRL (GLOVE) ×2 IMPLANT
GLOVE BIOGEL PI IND STRL 8 (GLOVE) ×1 IMPLANT
GLOVE BIOGEL PI IND STRL 8.5 (GLOVE) ×1 IMPLANT
GLOVE BIOGEL PI INDICATOR 8 (GLOVE) ×1
GLOVE BIOGEL PI INDICATOR 8.5 (GLOVE) ×1
GLOVE ECLIPSE 8.0 STRL XLNG CF (GLOVE) ×8 IMPLANT
GOWN PREVENTION PLUS LG XLONG (DISPOSABLE) ×4 IMPLANT
GOWN STRL REIN XL XLG (GOWN DISPOSABLE) ×4 IMPLANT
IMMOBILIZER KNEE 20 (SOFTGOODS) ×2
IMMOBILIZER KNEE 20 THIGH 36 (SOFTGOODS) ×1 IMPLANT
KIT BASIN OR (CUSTOM PROCEDURE TRAY) ×2 IMPLANT
MANIFOLD NEPTUNE II (INSTRUMENTS) ×2 IMPLANT
NEEDLE HYPO 22GX1.5 SAFETY (NEEDLE) ×2 IMPLANT
PACK TOTAL JOINT (CUSTOM PROCEDURE TRAY) ×2 IMPLANT
POSITIONER SURGICAL ARM (MISCELLANEOUS) ×4 IMPLANT
SPONGE GAUZE 4X4 12PLY (GAUZE/BANDAGES/DRESSINGS) ×2 IMPLANT
SPONGE LAP 18X18 X RAY DECT (DISPOSABLE) ×2 IMPLANT
SPONGE LAP 4X18 X RAY DECT (DISPOSABLE) ×2 IMPLANT
SPONGE SURGIFOAM ABS GEL 100 (HEMOSTASIS) ×2 IMPLANT
STAPLER VISISTAT 35W (STAPLE) ×2 IMPLANT
SUCTION FRAZIER TIP 10 FR DISP (SUCTIONS) ×2 IMPLANT
SUT VIC AB 0 CT1 27 (SUTURE) ×2
SUT VIC AB 0 CT1 27XBRD ANTBC (SUTURE) ×2 IMPLANT
SUT VIC AB 1 CT1 27 (SUTURE) ×5
SUT VIC AB 1 CT1 27XBRD ANTBC (SUTURE) ×5 IMPLANT
SYR 20CC LL (SYRINGE) ×2 IMPLANT
TAPE CLOTH SURG 6X10 WHT LF (GAUZE/BANDAGES/DRESSINGS) ×2 IMPLANT
TOWEL OR 17X26 10 PK STRL BLUE (TOWEL DISPOSABLE) ×4 IMPLANT
TOWEL OR NON WOVEN STRL DISP B (DISPOSABLE) ×2 IMPLANT
TRAY FOLEY CATH 14FRSI W/METER (CATHETERS) ×2 IMPLANT
WATER STERILE IRR 1500ML POUR (IV SOLUTION) ×4 IMPLANT

## 2012-02-05 NOTE — Interval H&P Note (Signed)
History and Physical Interval Note:  02/05/2012 7:15 AM  Anne Stevens  has presented today for surgery, with the diagnosis of avascular necrosis of left hip  The various methods of treatment have been discussed with the patient and family. After consideration of risks, benefits and other options for treatment, the patient has consented to  Procedure(s) (LRB) with comments: TOTAL HIP ARTHROPLASTY (Left) as a surgical intervention .  The patient's history has been reviewed, patient examined, no change in status, stable for surgery.  I have reviewed the patient's chart and labs.  Questions were answered to the patient's satisfaction.     [name]

## 2012-02-05 NOTE — Brief Op Note (Signed)
02/05/2012  9:19 AM  PATIENT:  Pervis Hocking  51 y.o. female  PRE-OPERATIVE DIAGNOSIS:  avascular necrosis of left hip  POST-OPERATIVE DIAGNOSIS:  avascular necrosis of left hip  PROCEDURE:  Procedure(s) (LRB) with comments: TOTAL HIP ARTHROPLASTY (Left)  SURGEON:  Surgeon(s) and Role:    * Jacki Cones, MD - Primary    * Shelda Pal, MD - Assisting  PHYSICIAN ASSISTANT:Matt Babish PA   ASSISTANTS:Mat Charlann Boxer MD   ANESTHESIA:   general  EBL:  Total I/O In: 1000 [I.V.:1000] Out: 560 [Urine:60; Blood:500]  BLOOD ADMINISTERED:none  DRAINS: (1) Hemovact drain(s) in the Left Hip with  Suction Open   LOCAL MEDICATIONS USED:  BUPIVICAINE 20cc mixed with 20cc normal saline  SPECIMEN:  No Specimen  DISPOSITION OF SPECIMEN:  N/A  COUNTS:  YES  TOURNIQUET:  * No tourniquets in log *  DICTATION: .Other Dictation: Dictation Number [id number]  PLAN OF CARE: Admit to inpatient   PATIENT DISPOSITION:  PACU - hemodynamically stable.   Delay start of Pharmacological VTE agent (>24hrs) due to surgical blood loss or risk of bleeding: yes

## 2012-02-05 NOTE — Progress Notes (Signed)
Utilization review completed.  

## 2012-02-05 NOTE — Anesthesia Postprocedure Evaluation (Signed)
  Anesthesia Post-op Note  Patient: Anne Stevens  Procedure(s) Performed: Procedure(s) (LRB): TOTAL HIP ARTHROPLASTY (Left)  Patient Location: PACU  Anesthesia Type: General  Level of Consciousness: awake and alert   Airway and Oxygen Therapy: Patient Spontanous Breathing  Post-op Pain: mild  Post-op Assessment: Post-op Vital signs reviewed, Patient's Cardiovascular Status Stable, Respiratory Function Stable, Patent Airway and No signs of Nausea or vomiting  Post-op Vital Signs: stable  Complications: No apparent anesthesia complications

## 2012-02-05 NOTE — Transfer of Care (Signed)
Immediate Anesthesia Transfer of Care Note  Patient: Anne Stevens  Procedure(s) Performed: Procedure(s) (LRB) with comments: TOTAL HIP ARTHROPLASTY (Left)  Patient Location: PACU  Anesthesia Type: General  Level of Consciousness: oriented, sedated and patient cooperative  Airway & Oxygen Therapy: Patient Spontanous Breathing  Post-op Assessment: Post -op Vital signs reviewed and stable and Patient moving all extremities  Post vital signs: Reviewed and stable  Complications: No apparent anesthesia complications

## 2012-02-05 NOTE — Anesthesia Preprocedure Evaluation (Signed)
Anesthesia Evaluation  Patient identified by MRN, date of birth, ID band Patient awake    Reviewed: Allergy & Precautions, H&P , NPO status , Patient's Chart, lab work & pertinent test results  Airway Mallampati: II TM Distance: <3 FB Neck ROM: Full    Dental No notable dental hx.    Pulmonary Current Smoker,  breath sounds clear to auscultation  Pulmonary exam normal       Cardiovascular negative cardio ROS  Rhythm:Regular Rate:Normal     Neuro/Psych CVA, Residual Symptoms negative psych ROS   GI/Hepatic negative GI ROS, Neg liver ROS,   Endo/Other  negative endocrine ROS  Renal/GU negative Renal ROS  negative genitourinary   Musculoskeletal negative musculoskeletal ROS (+)   Abdominal   Peds negative pediatric ROS (+)  Hematology negative hematology ROS (+)   Anesthesia Other Findings   Reproductive/Obstetrics negative OB ROS                           Anesthesia Physical Anesthesia Plan  ASA: III  Anesthesia Plan: General   Post-op Pain Management:    Induction: Intravenous  Airway Management Planned: Oral ETT  Additional Equipment:   Intra-op Plan:   Post-operative Plan: Extubation in OR  Informed Consent: I have reviewed the patients History and Physical, chart, labs and discussed the procedure including the risks, benefits and alternatives for the proposed anesthesia with the patient or authorized representative who has indicated his/her understanding and acceptance.   Dental advisory given  Plan Discussed with: CRNA and Surgeon  Anesthesia Plan Comments:         Anesthesia Quick Evaluation

## 2012-02-06 ENCOUNTER — Encounter (HOSPITAL_COMMUNITY): Payer: Self-pay | Admitting: Orthopedic Surgery

## 2012-02-06 LAB — CBC
MCH: 35.8 pg — ABNORMAL HIGH (ref 26.0–34.0)
MCHC: 35.1 g/dL (ref 30.0–36.0)
MCV: 102 fL — ABNORMAL HIGH (ref 78.0–100.0)
Platelets: 230 10*3/uL (ref 150–400)
RBC: 2.04 MIL/uL — ABNORMAL LOW (ref 3.87–5.11)
RDW: 14.8 % (ref 11.5–15.5)

## 2012-02-06 LAB — BASIC METABOLIC PANEL
CO2: 25 mEq/L (ref 19–32)
Calcium: 8.4 mg/dL (ref 8.4–10.5)
Creatinine, Ser: 1.03 mg/dL (ref 0.50–1.10)
GFR calc Af Amer: 71 mL/min — ABNORMAL LOW (ref >90)
GFR calc non Af Amer: 61 mL/min — ABNORMAL LOW (ref >90)
Sodium: 140 mEq/L (ref 135–145)

## 2012-02-06 LAB — PREPARE RBC (CROSSMATCH)

## 2012-02-06 MED ORDER — NICOTINE 21 MG/24HR TD PT24
21.0000 mg | MEDICATED_PATCH | Freq: Every day | TRANSDERMAL | Status: DC
  Administered 2012-02-06 – 2012-02-08 (×3): 21 mg via TRANSDERMAL
  Filled 2012-02-06 (×3): qty 1

## 2012-02-06 NOTE — Op Note (Signed)
NAMECLYTEE, HEINRICH                 ACCOUNT NO.:  [id number]  MEDICAL RECORD NO.:  [id number]  LOCATION:  1604                         FACILITY:  [facility]  PHYSICIAN:  Georges Lynch. Colin Norment, M.D.DATE OF BIRTH:  Dec 18, 1960  DATE OF PROCEDURE:  02/05/2012 DATE OF DISCHARGE:                              OPERATIVE REPORT   SURGEON:  Georges Lynch. [name], MD.  ASSISTANT:  Durene Romans, MD and Lanney Gins, PA.  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  OPERATION: 1. Total hip arthroplasty on the left.  Utilizing [employer] system.  The     cup used was a Pinnacle cup, size 52 mm diameter with 1 screw for     fixation, and we did use a hole eliminator in the cup. 2. The insert was a polyethylene cup 36 mm inside diameter +4.  The     femoral component was a size 9 Tri-Lock stem with a +5 ceramic     head, measuring 36 mm diameter.  PROCEDURE:  Under general anesthesia the patient on the right side and left side up.  The patient had 1 g of IV vancomycin.  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  At this time, the appropriate time-out was carried out first.  I also marked the appropriate left leg in the holding area.  The incision was made over the posterior lateral aspect of the left hip.  Bleeders identified and cauterized.  Self- retaining retractors were inserted.  At this time, the incision was carried down through the iliotibial band and by blunt dissection, I separated the portion of the gluteal muscle, great care was taken not to injure underlying sciatic nerve.  I then partially detached the external rotators and the capsule.  I then dislocated the head, amputated the femoral head at the appropriate neck length.  I then utilized the box osteotome to remove the cancellous bone in the trochanteric  area, then the widening reamer and then a canal finer.  I then rasped the femoral canal up to a size 9 Tri-Lock stem.  We then thoroughly irrigated out the canal during the procedure.  I packed the canal with a sponge, which was later removed with a the large sponge.  I then concentrated on the acetabulum and did a chest complete de-capsulectomy around the acetabulum.  We then reamed the acetabulum up to 51 mm for a 52 mm cup. The 52 mm cup was inserted, we had excellent fixation.  I utilized a Charnley guide to make sure we had the proper alignment me.  I then inserted the hole eliminator and then 1 drill hole was made in the cup in the acetabulum for 1 screw which is 30 mm in length for fixation. Following that, I inserted my permanent polyethylene cup.  I then removed the large sponge from the femoral canal, irrigated the canal and inserted my permanent size 9 femoral component which was a Tri-Lock stem.  We first  tested it with a +1.5 and then the  5, we felt the +5 was most stable and also gave [place] more equal leg length.  We did go through the proper measurements of the previous hip that was done on the right.  We thoroughly irrigated out the area.  After we did that we then applied our permanent ceramic head, a +5, 36 mm diameter.  We reduced the head and the acetabulum, first made sure all soft tissue was clear. We took the hip through motion.  We had excellent fixation.  I then reapproximated the capsule and soft tissue area and closed the wound layers in usual fashion over Hemovac drain.  I inserted some thrombin-soaked Gelfoam.  I also injected 40 mL mixture of 20 mL of __________ and 20 mL of normal saline.  Sterile dressings were applied after the wound was closed.  The patient left the operating room in satisfactory condition.          ______________________________ Georges Lynch [name], M.D.     RAG/MEDQ  D:  02/05/2012  T:  02/06/2012  Job:  [id number]

## 2012-02-06 NOTE — Progress Notes (Signed)
PT Cancellation Note  Evaluation cancelled today due to medical issues with patient which prohibited therapy.  Orders received and chart reviewed.  Per MD note, ambulate tomorrow after transfusion.  Pt also reports Dr. [name] discussed starting therapy tomorrow.  Will check back tomorrow to complete evaluation.  Naser Schuld,[name] 02/06/2012, 2:32 PM Pager: [phone]

## 2012-02-06 NOTE — Progress Notes (Signed)
Subjective: She has Acute Blood Loss Anemia,secondary to Surgery. Hemovac is out and we are transfusing her with 2-units of packed cells. Will ambulate tomorrow after transfusion and DC Foley when up.    Objective: Vital signs in last 24 hours: Temp:  [97.5 F (36.4 C)-99.7 F (37.6 C)] 99.7 F (37.6 C) (09/10 1310) Pulse Rate:  [86-99] 99  (09/10 1310) Resp:  [15-16] 16  (09/10 1310) BP: (96-109)/(53-69) 106/62 mmHg (09/10 1310) SpO2:  [100 %] 100 % (09/10 0629)  Intake/Output from previous day: 09/09 0701 - 09/10 0700 In: 5806.7 [P.O.:960; I.V.:4471.7; IV Piggyback:255] Out: 3240 [Urine:2560; Drains:180; Blood:500] Intake/Output this shift: Total I/O In: 1045 [P.O.:720; Blood:325] Out: 900 [Urine:900]   Basename 02/06/12 0357  HGB 7.3*    Basename 02/06/12 0357  WBC 6.6  RBC 2.04*  HCT 20.8*  PLT 230    Basename 02/06/12 0357  NA 140  K 3.5  CL 108  CO2 25  BUN 21  CREATININE 1.03  GLUCOSE 112*  CALCIUM 8.4   No results found for this basename: LABPT:2,INR:2 in the last 72 hours  Neurovascular intact Dorsiflexion/Plantar flexion intact  Assessment/Plan: DC Foley in A.M. And then ambulate.   Anne Stevens A 02/06/2012, 1:30 PM

## 2012-02-07 LAB — TYPE AND SCREEN
ABO/RH(D): B POS
Antibody Screen: NEGATIVE
Unit division: 0
Unit division: 0

## 2012-02-07 LAB — CBC
Hemoglobin: 9.3 g/dL — ABNORMAL LOW (ref 12.0–15.0)
RBC: 2.78 MIL/uL — ABNORMAL LOW (ref 3.87–5.11)
WBC: 7.6 10*3/uL (ref 4.0–10.5)

## 2012-02-07 LAB — BASIC METABOLIC PANEL
CO2: 23 mEq/L (ref 19–32)
Chloride: 107 mEq/L (ref 96–112)
Glucose, Bld: 117 mg/dL — ABNORMAL HIGH (ref 70–99)
Potassium: 3.6 mEq/L (ref 3.5–5.1)
Sodium: 137 mEq/L (ref 135–145)

## 2012-02-07 NOTE — Progress Notes (Signed)
Physical Therapy Treatment Note   02/07/12 1400  PT Visit Information  Last PT Received On 02/07/12  Assistance Needed +1  PT Time Calculation  PT Start Time 1326  PT Stop Time 1358  PT Time Calculation (min) 32 min  Subjective Data  Subjective I'm feeling much better this afternoon.  Precautions  Precautions Fall;Posterior Hip  Restrictions  Weight Bearing Restrictions Yes  LLE Weight Bearing PWB  LLE Partial Weight Bearing Percentage or Pounds <50%  Cognition  Overall Cognitive Status Appears within functional limits for tasks assessed/performed  Bed Mobility  Bed Mobility Sit to Supine  Sit to Supine 4: Min assist;HOB elevated  Details for Bed Mobility Assistance verbal cues for technique, assist for L LE  Transfers  Transfers Stand to Sit;Sit to Stand  Sit to Stand 4: Min guard;With upper extremity assist;From chair/3-in-1  Stand to Sit 4: Min assist;With upper extremity assist;To bed  Details for Transfer Assistance verbal cues for safe technique and hip precautions, pt denies dizziness   Ambulation/Gait  Ambulation/Gait Assistance 4: Min guard  Ambulation Distance (Feet) 100 Feet  Assistive device Rolling walker  Ambulation/Gait Assistance Details verbal cues for sequence and safe RW distance  Gait Pattern Step-to pattern;Decreased stance time - left;Antalgic  Gait velocity decreased  Total Joint Exercises  Ankle Circles/Pumps AROM;Both;20 reps  [employer] AROM;Strengthening;Both;20 reps  Short [employer] AAROM;Strengthening;20 reps;Left  Heel Slides AAROM;Left;20 reps (within precautions)  Hip ABduction/ADduction AAROM;Strengthening;Left;20 reps  PT - End of Session  Activity Tolerance Patient tolerated treatment well  Patient left in bed;with call bell/phone within reach;with family/visitor present  PT - Assessment/Plan  Comments on Treatment Session Pt feeling better this afternoon and ambulated in hallway and performed exercises.  PT Plan Discharge plan remains  appropriate;Frequency remains appropriate  Follow Up Recommendations Home health PT  Equipment Recommended None recommended by PT  Acute Rehab PT Goals  PT Goal: Sit to Supine/Side - Progress Progressing toward goal  PT Goal: Sit to Stand - Progress Progressing toward goal  PT Goal: Stand to Sit - Progress Progressing toward goal  PT Goal: Ambulate - Progress Progressing toward goal  PT Goal: Perform Home Exercise Program - Progress Progressing toward goal  PT General Charges  $$ ACUTE PT VISIT 1 Procedure  PT Treatments  $Gait Training 8-22 mins  $Therapeutic Exercise 8-22 mins    Zenovia Jarred, PT Pager: [phone]

## 2012-02-07 NOTE — Evaluation (Signed)
Physical Therapy Evaluation Patient Details Name: Anne Stevens MRN: [id number] DOB: Jun 15, 1960 Today's Date: 02/07/2012 Time: 9562-1308 PT Time Calculation (min): 27 min  PT Assessment / Plan / Recommendation Clinical Impression  Pt s/p L THR.  Pt reports she had R THR a year ago and able to verbalize posterior hip precautions with cues.  Pt would benefit from acute PT services in order to improve independence with transfers, ambulation, and stairs to prepare for d/c home with spouse.    PT Assessment  Patient needs continued PT services    Follow Up Recommendations  Home health PT    Barriers to Discharge        Equipment Recommendations  None recommended by PT    Recommendations for Other Services     Frequency 7X/week    Precautions / Restrictions Precautions Precautions: Fall;Posterior Hip Restrictions Weight Bearing Restrictions: Yes LLE Weight Bearing: Partial weight bearing LLE Partial Weight Bearing Percentage or Pounds: <50%   Pertinent Vitals/Pain 3/10 L hip, premedicated, repositioned      Mobility  Bed Mobility Bed Mobility: Supine to Sit Supine to Sit: 4: Min assist;With rails;HOB elevated Details for Bed Mobility Assistance: increased time to perform, support for L LE lowering foot to floor Transfers Transfers: Stand to Sit;Sit to Stand Sit to Stand: 4: Min assist;With upper extremity assist;From bed Stand to Sit: 4: Min assist;With upper extremity assist;To chair/3-in-1;To bed Details for Transfer Assistance: performed x2 as pt with increased dizziness upon first standing and became tearful so sat back down, educated pt that this reponse is normal for first time OOB, pt less dizzy with 2nd attempt Ambulation/Gait Ambulation/Gait Assistance: 4: Min assist Ambulation Distance (Feet): 34 Feet Assistive device: Rolling walker Ambulation/Gait Assistance Details: +2 for safety, verbal cues for technique, pt reports fatigue limiting distance, continued to  report slight lightheadedness but able to continue Gait Pattern: Step-to pattern;Decreased stance time - left;Antalgic Gait velocity: decreased    Exercises     PT Diagnosis: Difficulty walking  PT Problem List: Decreased strength;Decreased activity tolerance;Decreased mobility;Pain;Decreased knowledge of use of DME PT Treatment Interventions: DME instruction;Gait training;Stair training;Functional mobility training;Therapeutic activities;Therapeutic exercise;Patient/family education   PT Goals Acute Rehab PT Goals PT Goal Formulation: With patient Time For Goal Achievement: 02/14/12 Potential to Achieve Goals: Good Pt will go Supine/Side to Sit: with modified independence PT Goal: Supine/Side to Sit - Progress: Goal set today Pt will go Sit to Supine/Side: with modified independence PT Goal: Sit to Supine/Side - Progress: Goal set today Pt will go Sit to Stand: with supervision PT Goal: Sit to Stand - Progress: Goal set today Pt will go Stand to Sit: with supervision PT Goal: Stand to Sit - Progress: Goal set today Pt will Ambulate: >150 feet;with supervision;with least restrictive assistive device PT Goal: Ambulate - Progress: Goal set today Pt will Go Up / Down Stairs: 3-5 stairs;with rail(s);with supervision PT Goal: Up/Down Stairs - Progress: Goal set today Pt will Perform Home Exercise Program: with supervision, verbal cues required/provided PT Goal: Perform Home Exercise Program - Progress: Goal set today  Visit Information  Last PT Received On: 02/07/12 Assistance Needed: +1    Subjective Data  Subjective: I want to get moving.   Prior Functioning  Home Living Lives With: Spouse Type of Home: House Home Access: Stairs to enter Secretary/administrator of Steps: 5 Entrance Stairs-Rails: Can reach both Home Layout: Able to live on main level with bedroom/bathroom Home Adaptive Equipment: Walker - rolling;Straight cane;Bedside commode/3-in-1 Prior Function Level of  Independence: Independent with assistive device(s) Communication Communication: No difficulties    Cognition  Overall Cognitive Status: Appears within functional limits for tasks assessed/performed Arousal/Alertness: Awake/alert Orientation Level: Appears intact for tasks assessed    Extremity/Trunk Assessment Right Upper Extremity Assessment RUE ROM/Strength/Tone: [facility] for tasks assessed Left Upper Extremity Assessment LUE ROM/Strength/Tone: WFL for tasks assessed Right Lower Extremity Assessment RLE ROM/Strength/Tone: [facility] for tasks assessed Left Lower Extremity Assessment LLE ROM/Strength/Tone: Deficits LLE ROM/Strength/Tone Deficits: weak hip strength per functional observation   Balance    End of Session PT - End of Session Equipment Utilized During Treatment: Gait belt Activity Tolerance: Patient limited by fatigue Patient left: in chair;with call bell/phone within reach  GP     Soham Hollett,[name] 02/07/2012, 11:16 AM Pager: [phone]

## 2012-02-07 NOTE — Progress Notes (Signed)
Subjective: Doing Well. Hbg now 9.3 after 2-units packed cells.   Objective: Vital signs in last 24 hours: Temp:  [97 F (36.1 C)-99.7 F (37.6 C)] 98.8 F (37.1 C) (09/11 0440) Pulse Rate:  [85-104] 104  (09/11 0440) Resp:  [16] 16  (09/11 0440) BP: (95-115)/(60-73) 106/66 mmHg (09/11 0440) SpO2:  [97 %-100 %] 99 % (09/11 0440)  Intake/Output from previous day: 09/10 0701 - 09/11 0700 In: 2537.4 [P.O.:780; I.V.:1072; Blood:685.4] Out: 2650 [Urine:2650] Intake/Output this shift: Total I/O In: 360 [P.O.:360] Out: -    Basename 02/07/12 0415 02/06/12 0357  HGB 9.3* 7.3*    Basename 02/07/12 0415 02/06/12 0357  WBC 7.6 6.6  RBC 2.78* 2.04*  HCT 27.0* 20.8*  PLT 233 230    Basename 02/07/12 0415 02/06/12 0357  NA 137 140  K 3.6 3.5  CL 107 108  CO2 23 25  BUN 16 21  CREATININE 0.99 1.03  GLUCOSE 117* 112*  CALCIUM 8.7 8.4   No results found for this basename: LABPT:2,INR:2 in the last 72 hours  Neurovascular intact Dorsiflexion/Plantar flexion intact No cellulitis present  Assessment/Plan: DC Tomorrow.   Anne Stevens A 02/07/2012, 10:38 AM

## 2012-02-08 DIAGNOSIS — D62 Acute posthemorrhagic anemia: Secondary | ICD-10-CM

## 2012-02-08 LAB — CBC
Hemoglobin: 9.3 g/dL — ABNORMAL LOW (ref 12.0–15.0)
MCH: 33.7 pg (ref 26.0–34.0)
MCV: 98.9 fL (ref 78.0–100.0)
RBC: 2.76 MIL/uL — ABNORMAL LOW (ref 3.87–5.11)
WBC: 7 10*3/uL (ref 4.0–10.5)

## 2012-02-08 MED ORDER — OXYCODONE-ACETAMINOPHEN 10-325 MG PO TABS: 1.0000 | ORAL_TABLET | ORAL | Status: AC | PRN

## 2012-02-08 MED ORDER — RIVAROXABAN 10 MG PO TABS: 10.0000 mg | ORAL_TABLET | Freq: Every day | ORAL | Status: DC

## 2012-02-08 MED ORDER — METHOCARBAMOL 500 MG PO TABS: 500.0000 mg | ORAL_TABLET | Freq: Three times a day (TID) | ORAL | Status: DC

## 2012-02-08 MED ORDER — FERROUS SULFATE 325 (65 FE) MG PO TABS: 325.0000 mg | ORAL_TABLET | Freq: Three times a day (TID) | ORAL | Status: DC

## 2012-02-08 NOTE — Care Management Note (Signed)
    Page 1 of 2   02/08/2012     5:22:45 PM   CARE MANAGEMENT NOTE 02/08/2012  Patient:  Anne Stevens, Anne Stevens   Account Number:  [id number]  Date Initiated:  02/08/2012  Documentation initiated by:  Colleen Can  Subjective/Objective Assessment:   DX AVASCULAR NECROSIS LEFT HIP; TOTAL JIP REPLACEMNT     Action/Plan:   CM SPOKE WITH PATIENT AND SPOUSE. PLANS ARE FOR PT TO RETURN TO HER HOME WHERE SPOUSE WILL BE CAREGIVER. ALREADY HAS DME. HAS USED [facility] IN THE PAST AND WISHES TO USE AGAIN   Anticipated DC Date:  02/08/2012   Anticipated DC Plan:  HOME W HOME HEALTH SERVICES  In-house referral  NA      DC Planning Services  CM consult      [facility] Choice  HOME HEALTH   Choice offered to / List presented to:  C-1 Patient   DME arranged  NA      DME agency  NA     HH arranged  HH-2 PT      HH agency  [facility]   Status of service:  Completed, signed off Medicare Important Message given?  NO (If response is "NO", the following Medicare IM given date fields will be blank) Date Medicare IM given:   Date Additional Medicare IM given:    Discharge Disposition:  HOME W HOME HEALTH SERVICES  Per UR Regulation:  Reviewed for med. necessity/level of care/duration of stay  If discussed at Long Length of Stay Meetings, dates discussed:    Comments:  02/08/2012 Raynelle Bring BSN CCM [phone] [facility] CARE IN PLACE AND WILL START SERVICES 02/09/2012.

## 2012-02-08 NOTE — Progress Notes (Signed)
Physical Therapy Treatment Patient Details Name: Anne Stevens MRN: [id number] DOB: Feb 02, 1961 Today's Date: 02/08/2012 Time: 2956-2130 PT Time Calculation (min): 13 min  PT Assessment / Plan / Recommendation Comments on Treatment Session  Pt ambulated and performed stairs.  Pt states she is ready to d/c home today with spouse.  No further questions/concerns.    Follow Up Recommendations  Home health PT    Barriers to Discharge        Equipment Recommendations  None recommended by PT    Recommendations for Other Services    Frequency     Plan Discharge plan remains appropriate;Frequency remains appropriate    Precautions / Restrictions Precautions Precautions: Fall;Posterior Hip Precaution Comments: pt able to verbalize all precautions with increased time Restrictions Weight Bearing Restrictions: Yes LLE Weight Bearing: Partial weight bearing LLE Partial Weight Bearing Percentage or Pounds: <50%   Pertinent Vitals/Pain No pain reported.    Mobility  Transfers Transfers: Stand to Sit;Sit to Stand Sit to Stand: 5: Supervision;With upper extremity assist;From chair/3-in-1 Stand to Sit: 5: Supervision;With upper extremity assist;To chair/3-in-1 Details for Transfer Assistance: verbal cues for hip precautions Ambulation/Gait Ambulation/Gait Assistance: 5: Supervision Ambulation Distance (Feet): 180 Feet Assistive device: Rolling walker Ambulation/Gait Assistance Details: verbal cue for posture and neutral hip rotation Gait Pattern: Step-to pattern;Decreased stance time - left;Antalgic Gait velocity: decreased Stairs: Yes Stairs Assistance: 4: Min guard Stairs Assistance Details (indicate cue type and reason): pt reports going in the back would be better so performed 2 steps with rail on L and cues for sequence Stair Management Technique: One rail Left;Sideways;Step to pattern (2 hands on rail) Number of Stairs: 2  (performed x2)    Exercises     PT Diagnosis:    PT  Problem List:   PT Treatment Interventions:     PT Goals Acute Rehab PT Goals PT Goal: Sit to Stand - Progress: Met PT Goal: Stand to Sit - Progress: Met PT Goal: Ambulate - Progress: Met PT Goal: Up/Down Stairs - Progress: Progressing toward goal  Visit Information  Last PT Received On: 02/08/12 Assistance Needed: +1    Subjective Data  Subjective: I'm ready to go home today.   Cognition  Overall Cognitive Status: Appears within functional limits for tasks assessed/performed    Balance     End of Session PT - End of Session Activity Tolerance: Patient tolerated treatment well Patient left: in chair;with call bell/phone within reach   GP     Anne Stevens,Anne Stevens 02/08/2012, 11:40 AM Pager: [phone]

## 2012-02-08 NOTE — Progress Notes (Signed)
Subjective: Doing very well today. Catheter is out and has ambulated.Hbg is stable.   Objective: Vital signs in last 24 hours: Temp:  [97.8 F (36.6 C)-98.6 F (37 C)] 98.6 F (37 C) (09/12 0603) Pulse Rate:  [86-88] 88  (09/12 0603) Resp:  [16] 16  (09/12 0603) BP: (99-116)/(66-77) 116/77 mmHg (09/12 0603) SpO2:  [92 %-100 %] 92 % (09/12 0603)  Intake/Output from previous day: 09/11 0701 - 09/12 0700 In: 1560 [P.O.:1080; I.V.:425; IV Piggyback:55] Out: 3025 [Urine:3025] Intake/Output this shift:     Basename 02/08/12 0400 02/07/12 0415 02/06/12 0357  HGB 9.3* 9.3* 7.3*    Basename 02/08/12 0400 02/07/12 0415  WBC 7.0 7.6  RBC 2.76* 2.78*  HCT 27.3* 27.0*  PLT 226 233    Basename 02/07/12 0415 02/06/12 0357  NA 137 140  K 3.6 3.5  CL 107 108  CO2 23 25  BUN 16 21  CREATININE 0.99 1.03  GLUCOSE 117* 112*  CALCIUM 8.7 8.4   No results found for this basename: LABPT:2,INR:2 in the last 72 hours  Neurovascular intact Dorsiflexion/Plantar flexion intact No cellulitis present  Assessment/Plan: DC today,and will see in office in 1.5 weeks.   Anne Stevens A 02/08/2012, 7:10 AM

## 2012-02-08 NOTE — Progress Notes (Signed)
Discharge summary sent to payer through [facility]  

## 2012-02-12 NOTE — Discharge Summary (Signed)
Physician Discharge Summary   Patient ID: Anne Stevens MRN: [id number] DOB/AGE: [birth year] 51 y.o.  Admit date: 02/05/2012 Discharge date: 02/12/2012  Primary Diagnosis:  Avascular necrosis of left hip  Admission Diagnoses:  Past Medical History  Diagnosis Date  . Anxiety   . Asthma   . Heart murmur     as a child patient states outgrew   . Stroke     04/2010 , right arm and leg numbness  . Neuromuscular disorder     neuropathy secondary to chemo   . Arthritis     rheumatoid arthritis   . Headache     hx of migraines   . Cancer     ovarian cancer 1975 and 2011  . Anemia     hx of    Discharge Diagnoses:   Active Problems:  Avascular necrosis of bone of hip  Acute blood loss anemia S/P left total hip arthroplasty  Procedure: Procedure(s) (LRB): TOTAL HIP ARTHROPLASTY (Left)   Consults: None  HPI:  The patient is a 51 year old female who has been followed for her left hip pain for several months. She has increased pain in the left hip. It's really quite shocking to go back and look at her x-rays from 2012 and Oct 10, 2011 and compare those with the x-rays now. 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 She has had intra-articular injections with minimal relief. She has a right total hip arthroplasty done in September of 2012. Most predictable means for increased function and decreased pain in the left hip is a left total hip arthroplasty.      Laboratory Data: Hospital Outpatient Visit on 01/31/2012  Component Date Value Range Status  . aPTT 01/31/2012 39* 24 - 37 seconds Final   Comment:                                 IF BASELINE aPTT IS ELEVATED,                          SUGGEST PATIENT RISK ASSESSMENT                          BE USED TO DETERMINE APPROPRIATE                          ANTICOAGULANT THERAPY.  . Prothrombin Time 01/31/2012 14.1  11.6 - 15.2 seconds Final  . INR 01/31/2012 1.07  0.00 - 1.49 Final  . MRSA, PCR 01/31/2012 POSITIVE* NEGATIVE  Final  . Staphylococcus aureus 01/31/2012 POSITIVE* NEGATIVE Final   Comment:                                 The Xpert SA Assay (FDA                          approved for NASAL specimens                          in patients over 58 years of age),                          is one component of  a comprehensive surveillance                          program.  Test performance has                          been validated by [facility] for patients greater                          than or equal to 78 year old.                          It is not intended                          to diagnose infection nor to                          guide or monitor treatment.    X-Rays:Dg Cervical Spine 2-3 Views  02/01/2012  *RADIOLOGY REPORT*  Clinical Data: History of rheumatoid arthritis.  CERVICAL SPINE - 2-3 VIEW  Comparison: Cervical spine plain films 01/23/2011.  Findings: The appearance of the cervical spine is unchanged.  Again seen is loss of disc space height and endplate spurring at C5-6 where there is trace retrolisthesis of C5 on C6.  Anterior wedging of C7 is also again identified.  Prevertebral soft tissues appear normal.  No pathologic motion is seen with flexion extension.  IMPRESSION: Unchanged examination of the cervical spine with degenerative disc disease again seen at C5-6.   Original Report Authenticated By: Bernadene Bell. D'ALESSIO, M.D.    Dg Hip Portable 1 View Left  02/05/2012  *RADIOLOGY REPORT*  Clinical Data: Avascular necrosis left hip.  PORTABLE LEFT HIP - 1 VIEW  Comparison: None.  Findings: The patient is status post left total hip arthroplasty. Satisfactory position and alignment.  Surgical drain good position.  IMPRESSION: As above.   Original Report Authenticated By: Elsie Stain, M.D.     EKG: Orders placed during the hospital encounter of 02/05/12  . EKG     Hospital Course: Patient was admitted to [facility] and  taken to the OR and underwent the above state procedure without complications.  Patient tolerated the procedure well and was later transferred to the recovery room and then to the orthopaedic floor for postoperative care.  They were given PO and IV analgesics for pain control following their surgery.  They were given 24 hours of postoperative antibiotics and started on DVT prophylaxis in the form of Xarelto.   PT and OT were ordered for total joint protocol.  Discharge planning consulted to help with postop disposition and equipment needs.  Patient had a decent night on the evening of surgery but did not start to get up OOB with therapy on day one due to postop anemia requiring transfusion of 2 units of blood.  Hemovac drain was pulled without difficulty.  Started to work with therapy day two.  Dressing was changed on day two and the incision was clean and dry.  By day three, the patient had progressed with therapy and meeting their goals.  Incision was healing well.  Patient was  seen in rounds and was ready to go home.  Discharge Medications: Prior to Admission medications   Medication Sig Start Date End Date Taking? Authorizing Provider  ARIPiprazole (ABILIFY) 2 MG tablet Take 2 mg by mouth daily before breakfast.   Yes Historical Provider, MD  azaTHIOprine (IMURAN) 50 MG tablet Take 100 mg by mouth daily before breakfast. Pt reports not to take until a week out after surgery per reheumatologist as long as no infection   Yes Historical Provider, MD  clonazePAM (KLONOPIN) 1 MG tablet Take 1 mg by mouth 2 (two) times daily as needed. anxiety   Yes Historical Provider, MD  conjugated estrogens (PREMARIN) vaginal cream Place 0.5 g vaginally 2 (two) times a week.    Yes Historical Provider, MD  predniSONE (DELTASONE) 5 MG tablet Take 5 mg by mouth daily before breakfast.   Yes Historical Provider, MD  rosuvastatin (CRESTOR) 10 MG tablet Take 10 mg by mouth daily before breakfast.   Yes Historical Provider, MD   topiramate (TOPAMAX) 50 MG tablet Take 50-100 mg by mouth 2 (two) times daily. Takes 50 mg in the morning and 100 mg at night   Yes Historical Provider, MD  venlafaxine XR (EFFEXOR-XR) 150 MG 24 hr capsule Take 300 mg by mouth daily before breakfast.   Yes Historical Provider, MD  ferrous sulfate 325 (65 FE) MG tablet Take 1 tablet (325 mg total) by mouth 3 (three) times daily after meals. 02/08/12 02/07/13  Kannan Proia Tamala Ser, PA  methocarbamol (ROBAXIN) 500 MG tablet Take 1 tablet (500 mg total) by mouth 3 (three) times daily. 02/08/12   Dicy Smigel Tamala Ser, PA  oxyCODONE-acetaminophen (PERCOCET) 10-325 MG per tablet Take 1 tablet by mouth every 4 (four) hours as needed. Pain 02/08/12   Alem Fahl Tamala Ser, PA  rivaroxaban (XARELTO) 10 MG TABS tablet Take 1 tablet (10 mg total) by mouth daily with breakfast. 02/08/12   Keron Koffman Tamala Ser, PA    Diet: Cardiac diet Activity:WBAT No bending hip over 90 degrees- A "L" Angle Do not cross legs Do not let foot roll inward When turning these patients a pillow should be placed between the patient's legs to prevent crossing. Patients should have the affected knee fully extended when trying to sit or stand from all surfaces to prevent excessive hip flexion. When ambulating and turning toward the affected side the affected leg should have the toes turned out prior to moving the walker and the rest of patient's body as to prevent internal rotation/ turning in of the leg. Abduction pillows are the most effective way to prevent a patient from not crossing legs or turning toes in at rest. If an abduction pillow is not ordered placing a regular pillow length wise between the patient's legs is also an effective reminder. It is imperative that these precautions be maintained so that the surgical hip does not dislocate. Follow-up:in 2 weeks Disposition - Home Discharged Condition: fair   Discharge Orders    Future Orders Please Complete By Expires     Diet - low sodium heart healthy      Call MD / Call 911      Comments:   If you experience chest pain or shortness of breath, CALL 911 and be transported to the hospital emergency room.  If you develope a fever above 101 F, pus (white drainage) or increased drainage or redness at the wound, or calf pain, call your surgeon's office.   Constipation Prevention      Comments:  Drink plenty of fluids.  Prune juice may be helpful.  You may use a stool softener, such as Colace (over the counter) 100 mg twice a day.  Use MiraLax (over the counter) for constipation as needed.   Increase activity slowly as tolerated      Discharge instructions      Comments:   Walk with your walker. Weight bearing as instructed. Home Health Agency will follow you at home for your therapy  Change your dressing daily. Shower only, no tub bath. Call if any temperatures greater than 101 or any wound complications: [phone] during the day and ask for Dr. Jeannetta Ellis nurse, [facility]. You mat resume vitamins, supplements, and aspirin after completion of three weeks of Xarelto Follow up in 10 days   Driving restrictions      Comments:   No driving   Change dressing      Comments:   You may change your dressing daily with sterile 4 x 4 inch gauze dressing   Follow the hip precautions as taught in Physical Therapy          Medication List     As of 02/12/2012  7:29 AM    STOP taking these medications         aspirin 325 MG tablet      glucosamine-chondroitin 500-400 MG tablet      multivitamin with minerals tablet      vitamin C 500 MG tablet   Commonly known as: ASCORBIC ACID      TAKE these medications         ARIPiprazole 2 MG tablet   Commonly known as: ABILIFY   Take 2 mg by mouth daily before breakfast.      azaTHIOprine 50 MG tablet   Commonly known as: IMURAN   Take 100 mg by mouth daily before breakfast. Pt reports not to take until a week out after surgery per reheumatologist as long  as no infection      clonazePAM 1 MG tablet   Commonly known as: KLONOPIN   Take 1 mg by mouth 2 (two) times daily as needed. anxiety      conjugated estrogens vaginal cream   Commonly known as: PREMARIN   Place 0.5 g vaginally 2 (two) times a week.      ferrous sulfate 325 (65 FE) MG tablet   Take 1 tablet (325 mg total) by mouth 3 (three) times daily after meals.      methocarbamol 500 MG tablet   Commonly known as: ROBAXIN   Take 1 tablet (500 mg total) by mouth 3 (three) times daily.      oxyCODONE-acetaminophen 10-325 MG per tablet   Commonly known as: PERCOCET   Take 1 tablet by mouth every 4 (four) hours as needed. Pain      predniSONE 5 MG tablet   Commonly known as: DELTASONE   Take 5 mg by mouth daily before breakfast.      rivaroxaban 10 MG Tabs tablet   Commonly known as: XARELTO   Take 1 tablet (10 mg total) by mouth daily with breakfast.      rosuvastatin 10 MG tablet   Commonly known as: CRESTOR   Take 10 mg by mouth daily before breakfast.      topiramate 50 MG tablet   Commonly known as: TOPAMAX   Take 50-100 mg by mouth 2 (two) times daily. Takes 50 mg in the morning and 100 mg at night  venlafaxine XR 150 MG 24 hr capsule   Commonly known as: EFFEXOR-XR   Take 300 mg by mouth daily before breakfast.         Signed: Basha Krygier LAUREN 02/12/2012, 7:29 AM

## 2012-02-23 IMAGING — CT CT ABD-PELV W/ CM
2 of 5 series · 12 of 32 positions shown, 17 images · IV contrast (omnipaque)
Comparison: None.

CLINICAL DATA: Lower abdominal pain and fever.  History of ovarian
cancer with oophorectomy.

CT ABDOMEN AND PELVIS WITH CONTRAST
TECHNIQUE: Multidetector CT imaging of the abdomen and pelvis was
performed following the standard protocol during bolus
administration of intravenous contrast.
Contrast: 100 mL of Omnipaque 300 IV contrast

[Series 2: routine abdomen · axial · 0.76mm/px · z∈[-436,-141]mm · 5 of 88 slices shown, 10 images]
[im 15/88  soft-tissue]
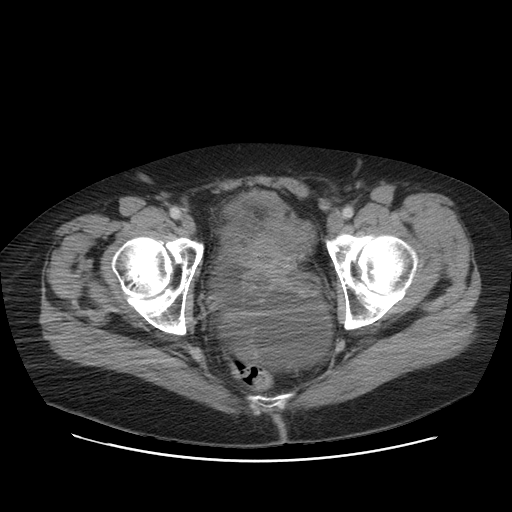
[im 15/88  bone]
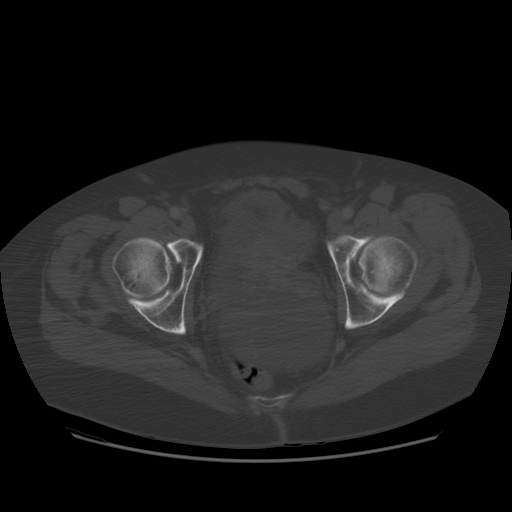
[im 30/88  soft-tissue]
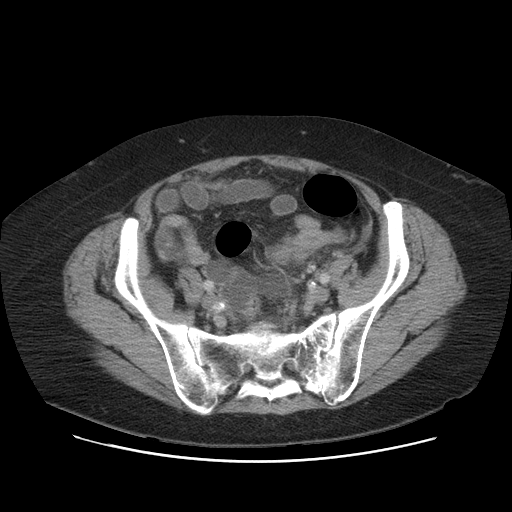
[im 30/88  lung]
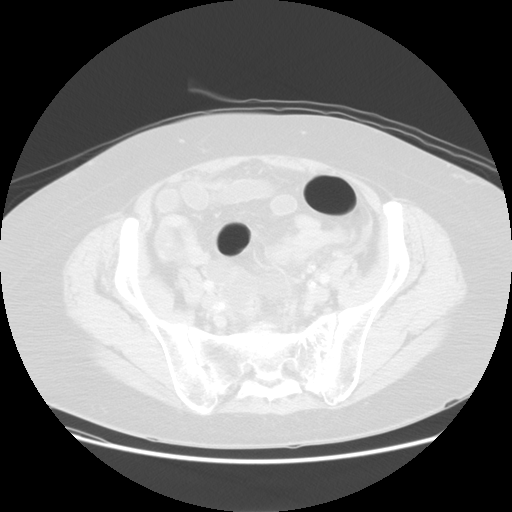
[im 44/88  soft-tissue]
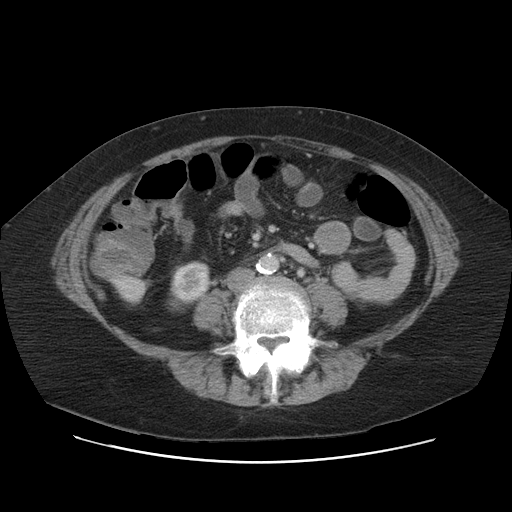
[im 44/88  lung]
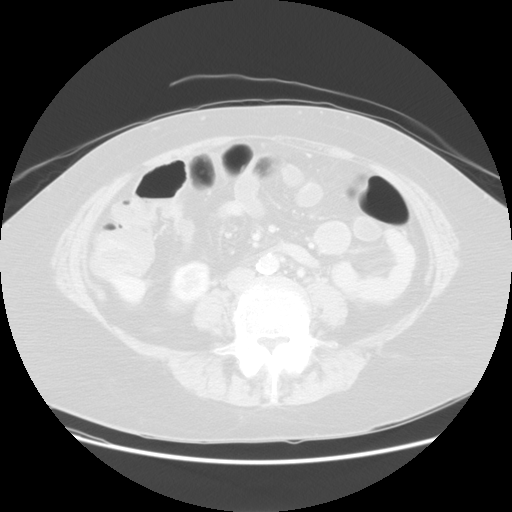
[im 59/88  soft-tissue]
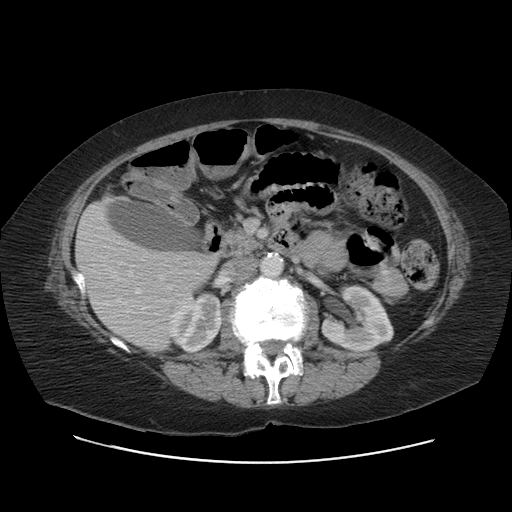
[im 59/88  lung]
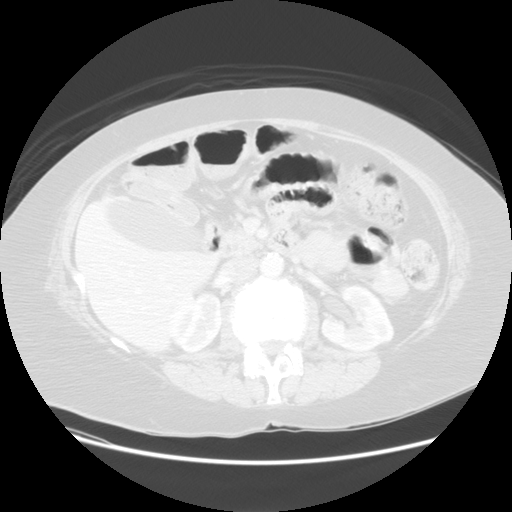
[im 73/88  soft-tissue]
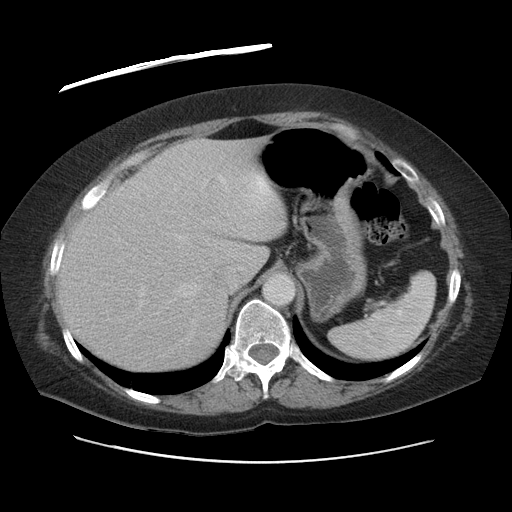
[im 73/88  lung]
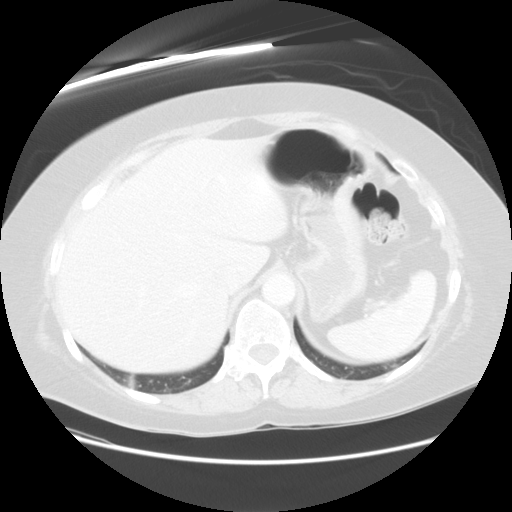

[Series 400: sag · sagittal · 0.94mm/px · 7 of 123 slices shown]
[im 13/123  soft-tissue]
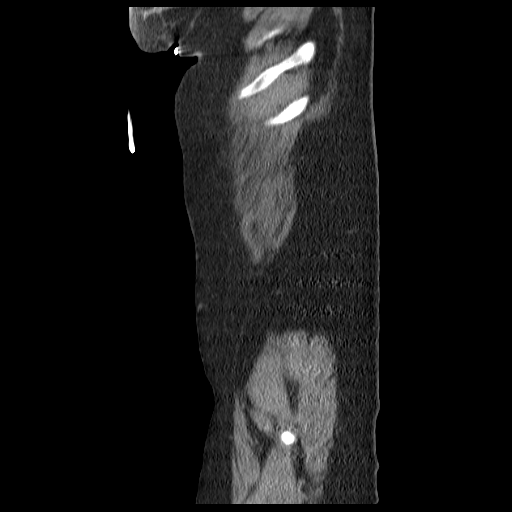
[im 25/123  soft-tissue]
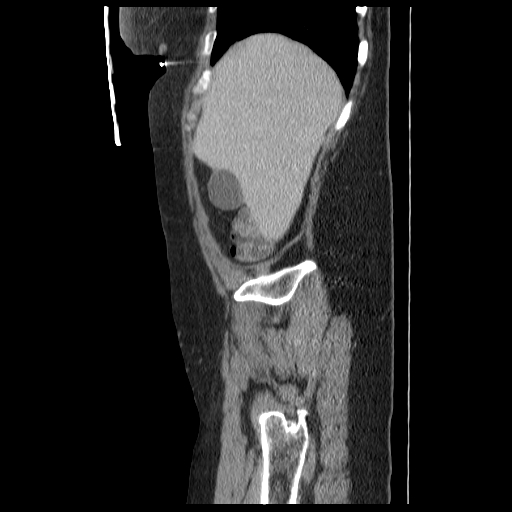
[im 37/123  soft-tissue]
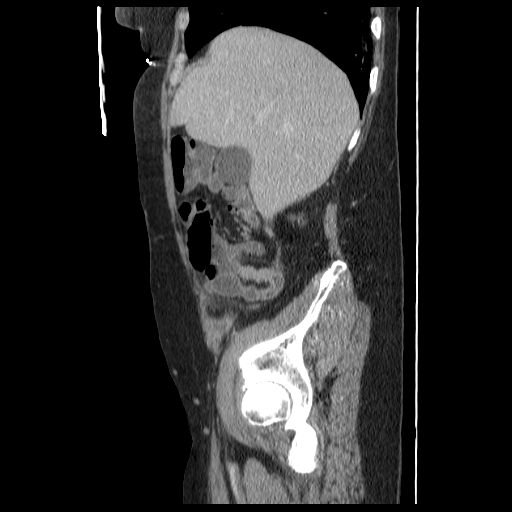
[im 49/123  soft-tissue]
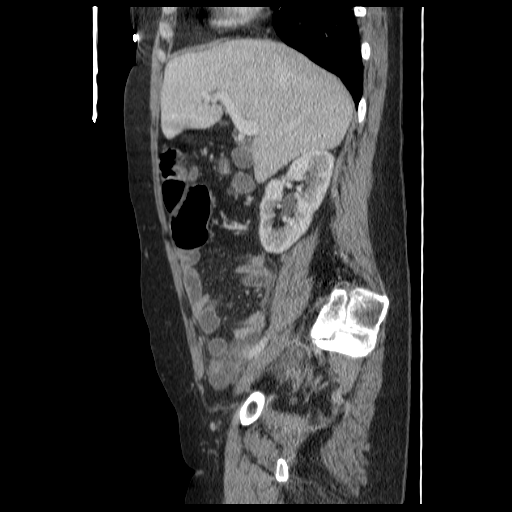
[im 74/123  soft-tissue]
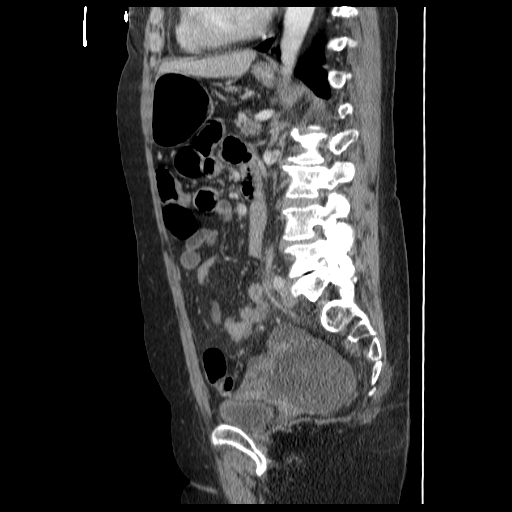
[im 86/123  soft-tissue]
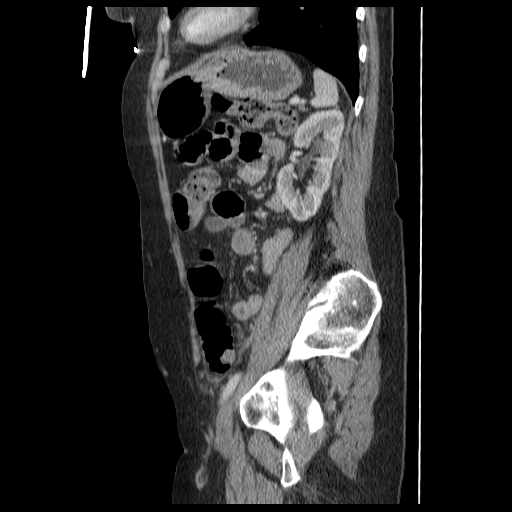
[im 98/123  soft-tissue]
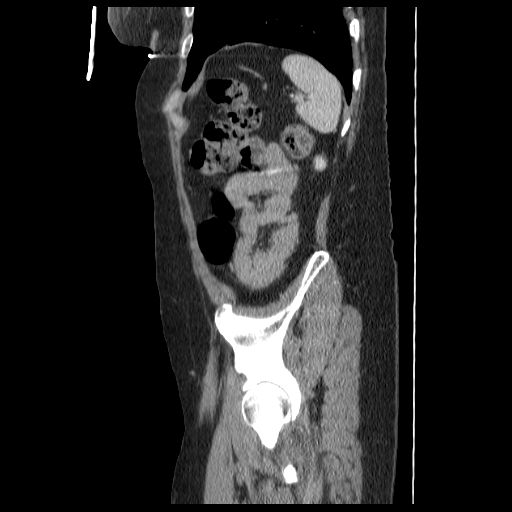

[12 of 32 positions shown; findings below may reference images not displayed]

FINDINGS: Minimal bibasilar atelectasis is noted. A single small 8
mm paraesophageal node is noted.

The liver and spleen are unremarkable in appearance.  Trace fluid
is noted about the inferior tip of the liver.  The gallbladder is
within normal limits.  The pancreas and adrenal glands are
unremarkable.

Very mild bilateral renal scarring is noted.  The kidneys are
otherwise unremarkable in appearance.  No significant
hydronephrosis is seen.  No renal or ureteral stones are
identified.  No perinephric stranding is noted.

The small bowel is unremarkable in appearance.  The stomach is
within normal limits.  No acute vascular abnormalities are seen.

The appendix is not seen; there is no evidence of appendicitis.
Aside from displacement of the sigmoid colon, as described below,
the colon is unremarkable in appearance.

There is a large multicystic mass within the pelvis, displacing the
sigmoid colon posteriorly.  This measures approximately 10.3 x
x 10.2 cm, and fills the normal contour of the pelvis.  This raises
concern for local recurrence of the patient's ovarian malignancy,
if the patient's prior malignancy was a cystadenocarcinoma.

Per clinical correlation, the patient has not undergone
hysterectomy; the uterus is grossly unremarkable in appearance.
The left ovary is also seen, directly adjacent to the mass.  No
inguinal lymphadenopathy is seen.  Stranding is noted extending
proximally along the retroperitoneum.

Trace free fluid is noted within the pelvis.

No acute osseous abnormalities are identified. There is narrowing
of the intervertebral disc space at L2-L3, with associated endplate
sclerotic change and vacuum phenomenon.  There is also vacuum
phenomenon at L5-S1, and mild grade 1 anterolisthesis at L4 on L5.
A large bone island is noted within the right iliac wing.
IMPRESSION: 1.  Large 10.3 cm multicystic mass within the pelvis, displacing
the sigmoid colon posteriorly.  This raises concern for local
recurrence of the patient's ovarian malignancy, if the patient's
prior malignancy was a cystadenocarcinoma.  The appearance is
atypical for abscess; it could conceivably reflect a very large
hydrosalpinx, given that recurrence of the patient's malignancy is
unlikely after several decades.  This could be further assessed
with contrast MRI of the pelvis.
2. Associated stranding noted tracking proximally along the
retroperitoneum, without a defined abscess; stranding is most
prominent between the left common iliac vein and overlying small
bowel.  Trace fluid noted in the pelvis.
3.  Trace fluid seen at the inferior tip of the liver.
4.  Small 8 mm paraesophageal node noted.
5.  Very mild bilateral renal scarring noted.
6.  Degenerative change noted along the lumbar spine.

Findings were discussed with Dr. Costelus Effe at [DATE] a.m. on
01/14/2010.

## 2012-12-26 ENCOUNTER — Encounter: Payer: Self-pay | Admitting: Neurology

## 2012-12-31 ENCOUNTER — Ambulatory Visit: Payer: Self-pay | Admitting: Neurology

## 2013-02-25 ENCOUNTER — Ambulatory Visit (INDEPENDENT_AMBULATORY_CARE_PROVIDER_SITE_OTHER): Payer: Medicare Other | Admitting: Neurology

## 2013-02-25 ENCOUNTER — Encounter: Payer: Self-pay | Admitting: Neurology

## 2013-02-25 VITALS — BP 106/80 | HR 91 | Temp 98.5°F | Ht 69.0 in | Wt 177.0 lb

## 2013-02-25 DIAGNOSIS — G8190 Hemiplegia, unspecified affecting unspecified side: Secondary | ICD-10-CM

## 2013-02-25 DIAGNOSIS — G8191 Hemiplegia, unspecified affecting right dominant side: Secondary | ICD-10-CM | POA: Insufficient documentation

## 2013-02-25 DIAGNOSIS — Z8673 Personal history of transient ischemic attack (TIA), and cerebral infarction without residual deficits: Secondary | ICD-10-CM

## 2013-02-25 DIAGNOSIS — E785 Hyperlipidemia, unspecified: Secondary | ICD-10-CM

## 2013-02-25 DIAGNOSIS — R4701 Aphasia: Secondary | ICD-10-CM | POA: Insufficient documentation

## 2013-02-25 MED ORDER — ROSUVASTATIN CALCIUM 10 MG PO TABS: 10.0000 mg | ORAL_TABLET | Freq: Every day | ORAL | Status: DC

## 2013-02-25 NOTE — Patient Instructions (Addendum)
Continue aspirin for stroke prevention and strict control of lipids with LDL cholesterol goal below 100 mg percent. Check fasting lipid profile and LFTs today. I have counseled the patient to quit smoking. Return for followup in 6 months or earlier if necessary.

## 2013-02-25 NOTE — Progress Notes (Signed)
[facility] [address] [place]. [place] [address] [phone]       OFFICE FOLLOW-UP NOTE  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 Vascular risk factors of hyperlipidemia and smoking. Update 02/25/2013  She returns for followup of her last visit on 06/04/12. She continues to have mild right-sided tingling in the arms as well as toes and diminished fine motor skills and mild weakness. She also*speaks slowly and carefully and at times has trouble finding words. She is trying to quit smoking and has switched to electronic cigarettes but not completely yet. She does not remember when she had her last lipid profile checked. She had carotid ultrasound done on 06/25/12 which showed no significant extracranial stenosis. She has a multitude of generalized complaints today but denies any new focal neurological symptoms. ROS:   14 system review of systems is positive for fatigue, leg swelling, wheezing, constipation, runny nose, allergies, rash, decreased energy, anxiety, depression, speech difficulties, weakness, numbness, confusion and memory loss.  PMH:  Past Medical History  Diagnosis Date  . Anxiety   . Asthma   . Heart murmur     as a child patient states outgrew   . Stroke     04/2010 , right arm and leg numbness  . Neuromuscular disorder     neuropathy secondary to chemo   . Arthritis     rheumatoid arthritis   . Headache(784.0)     hx of migraines   . Cancer     ovarian cancer 1975 and 2011  . Anemia     hx of     Social History:  History   Social History  . Marital Status: Married    Spouse Name: Johnny    Number of Children: 0  . Years of Education: HS   Occupational History  . Retired     Social History Main Topics  . Smoking status: Current Every Day Smoker -- 1.00 packs/day for 30 years  . Smokeless tobacco: Never Used  . Alcohol Use: Yes     Comment: occasional   . Drug Use: Yes    Special: Marijuana     Comment: 2011 use of marijuana   . Sexual Activity: Not on file   Other Topics Concern  . Not on file   Social History Narrative   Patient lives at home with spouse.   Caffeine Use:  2 cups daily    Medications:   Current Outpatient Prescriptions on File Prior to Visit  Medication Sig Dispense Refill  . ARIPiprazole (ABILIFY) 2 MG tablet Take 2 mg by mouth daily before breakfast.      . azaTHIOprine (IMURAN) 50 MG tablet Take 100 mg by mouth daily before breakfast. Pt reports not to take until a week out after surgery per reheumatologist as long as no infection      . clonazePAM (KLONOPIN) 1 MG tablet Take 1 mg by mouth 2 (two) times daily as needed. anxiety      . conjugated estrogens (PREMARIN) vaginal cream Place 0.5 g vaginally 2 (two) times a week.       . methocarbamol (ROBAXIN) 500 MG tablet Take 1 tablet (500 mg total) by mouth 3 (three) times daily.  30 tablet  1  . oxyCODONE-acetaminophen (PERCOCET) 10-325 MG per tablet Take 1 tablet by mouth every 4 (four) hours as needed. Pain  60 tablet  0  . predniSONE (DELTASONE) 5 MG tablet Take 5 mg by mouth daily before breakfast.      . topiramate (TOPAMAX) 50 MG tablet Take 50-100 mg by mouth 2 (two) times daily. Takes 50 mg in the morning and 100 mg at night      . venlafaxine XR (EFFEXOR-XR) 150 MG 24 hr capsule Take 300 mg by mouth daily before breakfast.       No current facility-administered medications on file prior to visit.    Allergies:  No Known Allergies  Physical Exam General: well developed, well nourished, seated, in no evident distress Head: head normocephalic and atraumatic. Orohparynx benign Neck: supple with no carotid or supraclavicular bruits Cardiovascular: regular rate and  rhythm, no murmurs Musculoskeletal: no deformity Skin:  no rash/petichiae Vascular:  Normal pulses all extremities Filed Vitals:   02/25/13 1525  BP: 106/80  Pulse: 91  Temp: 98.5 F (36.9 C)    Neurologic Exam Mental Status: Awake and fully alert. Oriented to place and time. Recent and remote memory intact. Attention span, concentration and fund of knowledge appropriate. Mood and affect appropriate. Speech is slightly nonfluent and hesitant but no paraphasic errors Cranial Nerves: Fundoscopic exam reveals sharp disc margins. Pupils equal, briskly reactive to light. Extraocular movements full without nystagmus. Visual fields show partial right homonymous hemianopsia to confrontation. Hearing intact. Facial sensation intact. Face, tongue, palate moves normally and symmetrically.  Motor: Normal bulk and tone. Normal strength in all tested extremity muscles. Diminished fine finger movements on the right and orbits left over right upper extremity. Sensory.: intact to tough and pinprick and vibratory.  Coordination: Rapid alternating movements normal in all extremities. Finger-to-nose and heel-to-shin performed accurately bilaterally. Gait and Station: Arises from chair without difficulty. Stance is normal. Gait demonstrates normal stride length and balance . Unable to heel, toe and tandem walk without difficulty.  Reflexes: 1+ and symmetric. Toes downgoing.   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 Vascular risk factors of hyperlipidemia and smoking.    PLAN: Continue aspirin for stroke prevention and strict control of lipids with LDL cholesterol goal below 100 mg percent. Check fasting lipid profile and LFTs today. I have counseled the patient to quit smoking.  Return for followup in 6 months or earlier if necessary.

## 2013-02-26 LAB — HEPATIC FUNCTION PANEL
ALT: 7 IU/L (ref 0–32)
AST: 13 IU/L (ref 0–40)
Albumin: 4.8 g/dL (ref 3.5–5.5)
Alkaline Phosphatase: 86 IU/L (ref 39–117)
Total Bilirubin: 0.2 mg/dL (ref 0.0–1.2)

## 2013-02-26 LAB — LIPID PANEL
Cholesterol, Total: 185 mg/dL (ref 100–199)
Triglycerides: 182 mg/dL — ABNORMAL HIGH (ref 0–149)

## 2013-04-16 ENCOUNTER — Telehealth: Payer: Self-pay | Admitting: Neurology

## 2013-04-17 NOTE — Telephone Encounter (Signed)
Bad cholesterol and LFTs are ok but triglycerides are up . Needs to see primary MD to discuss therapy-starting Tricor or Niacin

## 2013-04-17 NOTE — Telephone Encounter (Signed)
Spoke to patient. Informed. Patient agreed.  

## 2013-04-17 NOTE — Telephone Encounter (Signed)
Patient requesting lab results

## 2013-05-27 ENCOUNTER — Encounter: Payer: Self-pay | Admitting: Neurology

## 2013-06-13 ENCOUNTER — Telehealth: Payer: Self-pay | Admitting: Neurology

## 2013-06-13 NOTE — Telephone Encounter (Signed)
Please advise 

## 2013-06-13 NOTE — Telephone Encounter (Signed)
Ok to do so. Slight but acceptable risk of stroke during this period.

## 2013-06-13 NOTE — Telephone Encounter (Signed)
Patient states she is doing chemotheraphy and they instructed her to stop taking her aspirin for 5 days. Please call the patient to advise.

## 2013-06-16 NOTE — Telephone Encounter (Signed)
Patient was given the message and wants to know if she should stop taking the aspirin or not.  Patient also wants to know if the chemo is ok since you informed her  that the chemo caused her stroke, but she said she asked the oncologist if the chemo caused her stroke and she was informed that it did not.

## 2013-06-16 NOTE — Telephone Encounter (Signed)
Yes stop aspirin for 5 days before the procedure and restart later

## 2013-06-24 NOTE — Telephone Encounter (Signed)
Patient has already had the procedure done, and she backed off the aspirin to 81mg  but she did not stop taking it.

## 2013-07-31 ENCOUNTER — Telehealth: Payer: Self-pay | Admitting: Neurology

## 2013-07-31 NOTE — Telephone Encounter (Signed)
Patient calling to state that she was supposed to have chemo at [facility] but she was told that her platelet count was too low and they ordered for her to have some bloodwork on Monday. Patient is wondering if she can have it done here at our office, she has the orders with her. Please call patient and advise.

## 2013-08-01 NOTE — Telephone Encounter (Signed)
Called patient to inform her that because [facility] ordered lab work that she would have to go somewhere else outside of the office to get it done, per [place], Therapist, sports. I gave the patient the address to [place] on [address]. I advised the patient that if she has any other problems, questions or concerns to call the office. Patient verbalized understanding.

## 2013-08-25 ENCOUNTER — Ambulatory Visit: Payer: Medicare Other | Admitting: Neurology

## 2013-08-26 ENCOUNTER — Encounter (INDEPENDENT_AMBULATORY_CARE_PROVIDER_SITE_OTHER): Payer: Self-pay

## 2013-08-26 ENCOUNTER — Ambulatory Visit (INDEPENDENT_AMBULATORY_CARE_PROVIDER_SITE_OTHER): Payer: Medicare Other | Admitting: Neurology

## 2013-08-26 ENCOUNTER — Encounter: Payer: Self-pay | Admitting: Neurology

## 2013-08-26 VITALS — BP 127/87 | HR 124 | Ht 69.0 in | Wt 173.0 lb

## 2013-08-26 DIAGNOSIS — Z8673 Personal history of transient ischemic attack (TIA), and cerebral infarction without residual deficits: Secondary | ICD-10-CM

## 2013-08-26 NOTE — Progress Notes (Signed)
[facility] [address] [place]. [place] [address] [phone]       OFFICE FOLLOW-UP NOTE  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 Vascular risk factors of hyperlipidemia and smoking. Update 08/26/2013 : She returns for f/u after last visit 6 months ago. She remains stable from neuro vascular standpoint without recurrent stroke or TIA symptoms. She had lipid profile checked on 02/25/13 which was normal except for slightly elevated triglycerides of 181. She unfortunately hasn't had a recurrence of ovarian cancer since January this year and is currently getting chemotherapy once a week at [facility]. She seems quite depressed about this. She is currently seeing psychiatrist Dr. Letta Moynahan and is being treated with Abilify and Effexor. She seems quite determined to fight her cancer. She is being given a hard time by her insurance company about Crestor and is wondering if she should switch to a generic. Update 02/25/2013  She returns for followup of her last visit on 06/04/12. She continues to have mild right-sided tingling in the arms as well as toes and diminished fine motor skills and mild weakness. She also*speaks slowly and carefully and at times has trouble finding words. She is trying to quit smoking and has switched to electronic cigarettes but not completely yet. She does not remember when she had her last lipid profile checked. She had carotid ultrasound done on 06/25/12 which showed no significant extracranial stenosis. She has a multitude of generalized complaints today but denies any new focal neurological symptoms. ROS:   14 system review of systems is positive for fatigue, appetite change,  yellowish arch, shortness of breath, joint pain and swelling, daytime sleepiness, easy bruisability, dizziness, numbness, weakness, depression and anxiety. All other systems negative. PMH:  Past Medical History  Diagnosis Date  . Anxiety   . Asthma   . Heart murmur     as a child patient states outgrew   . Stroke     04/2010 , right arm and leg numbness  . Neuromuscular disorder     neuropathy secondary to chemo   . Arthritis     rheumatoid arthritis   . Headache(784.0)     hx of migraines   . Cancer     ovarian cancer 1975 and 2011 & 2015  . Anemia     hx of     Social History:  History   Social History  . Marital Status: Married    Spouse Name: Johnny    Number of Children: 0  . Years of Education: HS   Occupational History  . Retired    Social History Main Topics  . Smoking status: Current Every Day Smoker -- 1.00 packs/day for 30 years  . Smokeless tobacco: Never Used  . Alcohol Use: Yes     Comment: occasional   . Drug Use: Yes    Special: Marijuana     Comment: OCC. Marijuana   . Sexual Activity: Not on file   Other Topics Concern  . Not on file   Social History Narrative   Patient lives at home with spouse.   Caffeine Use:  2 cups daily    Medications:   Current Outpatient Prescriptions on File Prior to Visit  Medication  Sig Dispense Refill  . ARIPiprazole (ABILIFY) 2 MG tablet Take 2 mg by mouth daily before breakfast.      . azaTHIOprine (IMURAN) 50 MG tablet Take 100 mg by mouth daily before breakfast. Pt reports not to take until a week out after surgery per reheumatologist as long as no infection      . clonazePAM (KLONOPIN) 1 MG tablet Take 1 mg by mouth 2 (two) times daily as needed. anxiety      . oxyCODONE-acetaminophen (PERCOCET) 10-325 MG per tablet Take 1 tablet by mouth every 4 (four) hours as needed. Pain  60 tablet  0  . predniSONE (DELTASONE) 5 MG tablet Take 5 mg by mouth daily before breakfast.      . rosuvastatin (CRESTOR) 10 MG  tablet Take 1 tablet (10 mg total) by mouth daily before breakfast.  90 tablet  3  . topiramate (TOPAMAX) 50 MG tablet Take 50-100 mg by mouth 2 (two) times daily. Takes 50 mg in the morning and 100 mg at night      . venlafaxine XR (EFFEXOR-XR) 150 MG 24 hr capsule Take 300 mg by mouth daily before breakfast.       No current facility-administered medications on file prior to visit.    Allergies:  No Known Allergies  Physical Exam General: well developed, well nourished, seated, in no evident distress Head: head normocephalic and atraumatic. Orohparynx benign Neck: supple with no carotid or supraclavicular bruits Cardiovascular: regular rate and rhythm, no murmurs Musculoskeletal: no deformity Skin:  no rash/petichiae Vascular:  Normal pulses all extremities Filed Vitals:   08/26/13 1459  BP: 127/87  Pulse: 124    Neurologic Exam Mental Status: Awake and fully alert. Oriented to place and time. Recent and remote memory intact. Attention span, concentration and fund of knowledge appropriate. Mood and affect appropriate. Speech is slightly nonfluent and hesitant but no paraphasic errors. Emotionally labile and cries a little Cranial Nerves: Fundoscopic exam not done . Pupils equal, briskly reactive to light. Extraocular movements full without nystagmus. Visual fields show partial right homonymous hemianopsia to confrontation. Hearing intact. Facial sensation intact. Face, tongue, palate moves normally and symmetrically.  Motor: Normal bulk and tone. Normal strength in all tested extremity muscles. Diminished fine finger movements on the right and orbits left over right upper extremity. Sensory.: intact to touch and pinprick and vibratory sensation.  Coordination: Rapid alternating movements normal in all extremities. Finger-to-nose and heel-to-shin performed accurately bilaterally. Gait and Station: Arises from chair without difficulty. Stance is normal. Gait demonstrates normal stride  length and balance . Unable to heel, toe and tandem walk without difficulty.  Reflexes: 1+ and symmetric. Toes downgoing.      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 Vascular risk factors of hyperlipidemia and smoking.    PLAN: I had a long discussion with the patient regarding her remote stroke and his factors and need for ongoing second of stroke prevention strategies. She has unfortunately been diagnosed with recurrence of ovarian cancer and is clearly upset about this. I recommended she stay on aspirin for stroke prevention and maintain strict control of lipids his LDL cholesterol goal below 100 mg percent and hypertension blood pressure goal below 130/90. She was advised to stop aspirin for a few days in case she develops chemotherapy-induced temporary thrombocytopenia upon advice  from her oncologist .She was advised to see a psychiatrist Dr. Caprice Beaver for optimization of treatment for depression. She will return for followup to see me in 6 months or earlier if necessary.

## 2013-08-26 NOTE — Patient Instructions (Signed)
I had a long discussion with the patient regarding her remote stroke and his factors and need for ongoing second of stroke prevention strategies. She has unfortunately been diagnosed with recurrence of ovarian cancer and is clearly upset about this. I recommended she stay on aspirin for stroke prevention and maintain strict control of lipids his LDL cholesterol goal below 100 mg percent and hypertension blood pressure goal below 130/90. She was advised to stop aspirin for a few days in case she develops chemotherapy-induced temporary thrombocytopenia upon advice from her oncologist .She was advised to see a psychiatrist Dr. Caprice Beaver for optimization of treatment for depression. She will return for followup to see me in 6 months or earlier if necessary.

## 2013-12-31 NOTE — Telephone Encounter (Signed)
Noted  

## 2014-01-01 NOTE — Telephone Encounter (Signed)
Noted  

## 2014-02-25 ENCOUNTER — Ambulatory Visit: Payer: Medicare Other | Admitting: Neurology

## 2014-03-25 ENCOUNTER — Ambulatory Visit (INDEPENDENT_AMBULATORY_CARE_PROVIDER_SITE_OTHER): Payer: Medicare Other | Admitting: Neurology

## 2014-03-25 ENCOUNTER — Encounter: Payer: Self-pay | Admitting: Neurology

## 2014-03-25 VITALS — BP 131/87 | HR 92 | Ht 69.0 in | Wt 161.6 lb

## 2014-03-25 DIAGNOSIS — I639 Cerebral infarction, unspecified: Secondary | ICD-10-CM

## 2014-03-25 MED ORDER — ATORVASTATIN CALCIUM 20 MG PO TABS
20.0000 mg | ORAL_TABLET | Freq: Every day | ORAL | Status: DC
Start: 2014-03-25 — End: 2014-11-02

## 2014-03-25 NOTE — Patient Instructions (Addendum)
I had a long discussion with the patient with regards to her remote stroke in 2011 and secondary prevention strategies and answered questions. I counseled her again to quit smoking. Continue aspirin for stroke prevention. Return for followup in the future only if necessary and no scheduled follow up appointment was made. Patient was given prescription for generic Lipitor which was will be covered by her insurance instead of Crestor

## 2014-03-25 NOTE — Progress Notes (Signed)
[facility] [address] [place]. [place] [address] [phone]       OFFICE FOLLOW-UP NOTE  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 Vascular risk factors of hyperlipidemia and smoking. Update 08/26/2013 : She returns for f/u after last visit 6 months ago. She remains stable from neuro vascular standpoint without recurrent stroke or TIA symptoms. She had lipid profile checked on 02/25/13 which was normal except for slightly elevated triglycerides of 181. She unfortunately hasn't had a recurrence of ovarian cancer since January this year and is currently getting chemotherapy once a week at [facility]. She seems quite depressed about this. She is currently seeing psychiatrist Dr. Letta Moynahan and is being treated with Abilify and Effexor. She seems quite determined to fight her cancer. She is being given a hard time by her insurance company about Crestor and is wondering if she should switch to a generic. Update 02/25/2013  She returns for followup of her last visit on 06/04/12. She continues to have mild right-sided tingling in the arms as well as toes and diminished fine motor skills and mild weakness. She also*speaks slowly and carefully and at times has trouble finding words. She is trying to quit smoking and has switched to electronic cigarettes but not completely yet. She does not remember when she had her last lipid profile checked. She had carotid ultrasound done on 06/25/12 which showed no significant extracranial stenosis. She has a multitude of generalized complaints today but denies any new focal neurological symptoms. Update 03/25/2014 :( PS) she returns for followup of the last visit 1 year ago.  She continues to do well from neurovascular standpoint without recurrent stroke but he has symptoms since 2011. She still has some occasional word finding difficulties particularly when she is excited or tired or tries to speak quickly. She has unfortunately been diagnosed with recurrence of ovarian cancer and is currently on chemotherapy. She feels quite weak and is any making plans to get a blood transfusion tomorrow. She has had minor intermittent headaches off and on she takes Pamelor which usually works. She is requesting her prescription to switch from brand name Crestor generic Lipitor as her insurance company will cover that. She continues to smoke despite being counseled to quit. ROS:   14 system review of systems is positive for fatigue,high discharge, excessive thirst, dizziness, headache, weakness, anxiety and nervousness.All other systems negative. PMH:  Past Medical History  Diagnosis Date  . Anxiety   . Asthma   . Heart murmur     as a child patient states outgrew   . Stroke     04/2010 , right arm and leg numbness  . Neuromuscular disorder     neuropathy secondary to chemo   . Arthritis     rheumatoid arthritis   . Headache(784.0)     hx of migraines   . Cancer     ovarian cancer 1975 and 2011 & 2015  . Anemia     hx of     Social History:  History   Social History  . Marital Status: Married    Spouse Name: Johnny    Number of Children: 0  . Years of Education: HS   Occupational History  .  Retired    Social History Main Topics  . Smoking status: Current Every Day Smoker -- 1.00 packs/day for 30 years  . Smokeless tobacco: Never Used  . Alcohol Use: No     Comment: occasional   . Drug Use: Yes    Special: Marijuana     Comment: OCC. Marijuana   . Sexual Activity: Not on file   Other Topics Concern  . Not on file   Social History Narrative   Patient lives at home with spouse.   Patient is right handed.   Patient has 12 th grade education.   Caffeine  Use:  2 cups daily    Medications:   Current Outpatient Prescriptions on File Prior to Visit  Medication Sig Dispense Refill  . ARIPiprazole (ABILIFY) 2 MG tablet Take 2 mg by mouth daily before breakfast.      . clonazePAM (KLONOPIN) 1 MG tablet Take 1 mg by mouth 2 (two) times daily as needed. anxiety      . oxyCODONE-acetaminophen (PERCOCET) 10-325 MG per tablet Take 1 tablet by mouth every 4 (four) hours as needed. Pain  60 tablet  0  . topiramate (TOPAMAX) 50 MG tablet Take 50-100 mg by mouth 2 (two) times daily. Takes 50 mg in the morning and 100 mg at night      . venlafaxine XR (EFFEXOR-XR) 150 MG 24 hr capsule Take 300 mg by mouth daily before breakfast.       No current facility-administered medications on file prior to visit.    Allergies:  No Known Allergies  Physical Exam General: well developed, well nourished, seated, in no evident distress Head: head normocephalic and atraumatic. Orohparynx benign Neck: supple with no carotid or supraclavicular bruits Cardiovascular: regular rate and rhythm, no murmurs Musculoskeletal: no deformity Skin:  no rash/petichiae Vascular:  Normal pulses all extremities Filed Vitals:   03/25/14 1430  BP: 131/87  Pulse: 92    Neurologic Exam Mental Status: Awake and fully alert. Oriented to place and time. Recent and remote memory intact. Attention span, concentration and fund of knowledge appropriate. Mood and affect appropriate. Speech is slightly nonfluent and hesitant but no paraphasic errors. Emotionally labile and cries a little Cranial Nerves: Fundoscopic exam not done . Pupils equal, briskly reactive to light. Extraocular movements full without nystagmus. Visual fields show partial right homonymous hemianopsia to confrontation. Hearing intact. Facial sensation intact. Face, tongue, palate moves normally and symmetrically.  Motor: Normal bulk and tone. Normal strength in all tested extremity muscles. Diminished fine finger movements  on the right and orbits left over right upper extremity. Sensory.: intact to touch and pinprick and vibratory sensation.  Coordination: Rapid alternating movements normal in all extremities. Finger-to-nose and heel-to-shin performed accurately bilaterally. Gait and Station: Arises from chair without difficulty. Stance is normal. Gait demonstrates normal stride length and balance . Unable to heel, toe and tandem walk without difficulty.  Reflexes: 1+ and symmetric. Toes downgoing.      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 Vascular risk factors of hyperlipidemia and smoking.    PLAN: I had a long discussion with the patient with regards to her remote stroke in 2011 and secondary prevention strategies and answered questions. I counseled her again to quit smoking. Continue aspirin for stroke prevention. Return for followup in the future only  if necessary and no scheduled follow up appointment was made. Patient was given prescription for generic Lipitor which was will be covered by her insurance instead of Crestor

## 2014-09-30 ENCOUNTER — Other Ambulatory Visit (HOSPITAL_COMMUNITY): Payer: Self-pay

## 2014-09-30 ENCOUNTER — Inpatient Hospital Stay (HOSPITAL_COMMUNITY)
Admission: EM | Admit: 2014-09-30 | Discharge: 2014-10-03 | DRG: 291 | Disposition: A | Discharge status: Home / Self Care | Payer: Medicare Other | Attending: Internal Medicine | Admitting: Internal Medicine

## 2014-09-30 ENCOUNTER — Encounter (HOSPITAL_COMMUNITY): Payer: Self-pay | Admitting: Emergency Medicine

## 2014-09-30 ENCOUNTER — Emergency Department (HOSPITAL_COMMUNITY): Payer: Medicare Other

## 2014-09-30 DIAGNOSIS — T451X5A Adverse effect of antineoplastic and immunosuppressive drugs, initial encounter: Secondary | ICD-10-CM | POA: Diagnosis present

## 2014-09-30 DIAGNOSIS — Z96643 Presence of artificial hip joint, bilateral: Secondary | ICD-10-CM | POA: Diagnosis present

## 2014-09-30 DIAGNOSIS — R0600 Dyspnea, unspecified: Secondary | ICD-10-CM | POA: Diagnosis not present

## 2014-09-30 DIAGNOSIS — I69351 Hemiplegia and hemiparesis following cerebral infarction affecting right dominant side: Secondary | ICD-10-CM | POA: Diagnosis not present

## 2014-09-30 DIAGNOSIS — Z681 Body mass index (BMI) 19 or less, adult: Secondary | ICD-10-CM

## 2014-09-30 DIAGNOSIS — M1990 Unspecified osteoarthritis, unspecified site: Secondary | ICD-10-CM | POA: Diagnosis present

## 2014-09-30 DIAGNOSIS — D631 Anemia in chronic kidney disease: Secondary | ICD-10-CM | POA: Insufficient documentation

## 2014-09-30 DIAGNOSIS — M069 Rheumatoid arthritis, unspecified: Secondary | ICD-10-CM | POA: Diagnosis present

## 2014-09-30 DIAGNOSIS — G620 Drug-induced polyneuropathy: Secondary | ICD-10-CM | POA: Diagnosis present

## 2014-09-30 DIAGNOSIS — C569 Malignant neoplasm of unspecified ovary: Secondary | ICD-10-CM | POA: Insufficient documentation

## 2014-09-30 DIAGNOSIS — I5043 Acute on chronic combined systolic (congestive) and diastolic (congestive) heart failure: Secondary | ICD-10-CM | POA: Insufficient documentation

## 2014-09-30 DIAGNOSIS — G8191 Hemiplegia, unspecified affecting right dominant side: Secondary | ICD-10-CM

## 2014-09-30 DIAGNOSIS — N189 Chronic kidney disease, unspecified: Secondary | ICD-10-CM

## 2014-09-30 DIAGNOSIS — E43 Unspecified severe protein-calorie malnutrition: Secondary | ICD-10-CM | POA: Insufficient documentation

## 2014-09-30 DIAGNOSIS — F419 Anxiety disorder, unspecified: Secondary | ICD-10-CM | POA: Diagnosis present

## 2014-09-30 DIAGNOSIS — Z8673 Personal history of transient ischemic attack (TIA), and cerebral infarction without residual deficits: Secondary | ICD-10-CM

## 2014-09-30 DIAGNOSIS — E785 Hyperlipidemia, unspecified: Secondary | ICD-10-CM | POA: Diagnosis present

## 2014-09-30 DIAGNOSIS — Z720 Tobacco use: Secondary | ICD-10-CM | POA: Diagnosis not present

## 2014-09-30 DIAGNOSIS — J449 Chronic obstructive pulmonary disease, unspecified: Secondary | ICD-10-CM | POA: Diagnosis present

## 2014-09-30 DIAGNOSIS — I5041 Acute combined systolic (congestive) and diastolic (congestive) heart failure: Secondary | ICD-10-CM | POA: Diagnosis present

## 2014-09-30 DIAGNOSIS — I509 Heart failure, unspecified: Secondary | ICD-10-CM

## 2014-09-30 DIAGNOSIS — N179 Acute kidney failure, unspecified: Secondary | ICD-10-CM | POA: Diagnosis present

## 2014-09-30 DIAGNOSIS — Z90710 Acquired absence of both cervix and uterus: Secondary | ICD-10-CM | POA: Diagnosis not present

## 2014-09-30 DIAGNOSIS — F17210 Nicotine dependence, cigarettes, uncomplicated: Secondary | ICD-10-CM | POA: Diagnosis present

## 2014-09-30 DIAGNOSIS — C787 Secondary malignant neoplasm of liver and intrahepatic bile duct: Secondary | ICD-10-CM | POA: Diagnosis present

## 2014-09-30 DIAGNOSIS — I69320 Aphasia following cerebral infarction: Secondary | ICD-10-CM

## 2014-09-30 DIAGNOSIS — I5021 Acute systolic (congestive) heart failure: Secondary | ICD-10-CM | POA: Diagnosis not present

## 2014-09-30 DIAGNOSIS — R4701 Aphasia: Secondary | ICD-10-CM | POA: Diagnosis present

## 2014-09-30 DIAGNOSIS — D649 Anemia, unspecified: Secondary | ICD-10-CM | POA: Diagnosis not present

## 2014-09-30 DIAGNOSIS — I340 Nonrheumatic mitral (valve) insufficiency: Secondary | ICD-10-CM | POA: Diagnosis present

## 2014-09-30 DIAGNOSIS — R609 Edema, unspecified: Secondary | ICD-10-CM | POA: Diagnosis not present

## 2014-09-30 DIAGNOSIS — I429 Cardiomyopathy, unspecified: Secondary | ICD-10-CM | POA: Diagnosis present

## 2014-09-30 DIAGNOSIS — J45909 Unspecified asthma, uncomplicated: Secondary | ICD-10-CM | POA: Diagnosis present

## 2014-09-30 DIAGNOSIS — N183 Chronic kidney disease, stage 3 unspecified: Secondary | ICD-10-CM | POA: Insufficient documentation

## 2014-09-30 DIAGNOSIS — I2510 Atherosclerotic heart disease of native coronary artery without angina pectoris: Secondary | ICD-10-CM | POA: Diagnosis present

## 2014-09-30 DIAGNOSIS — R600 Localized edema: Secondary | ICD-10-CM | POA: Diagnosis not present

## 2014-09-30 DIAGNOSIS — F329 Major depressive disorder, single episode, unspecified: Secondary | ICD-10-CM | POA: Diagnosis present

## 2014-09-30 DIAGNOSIS — Z7982 Long term (current) use of aspirin: Secondary | ICD-10-CM | POA: Diagnosis not present

## 2014-09-30 DIAGNOSIS — N19 Unspecified kidney failure: Secondary | ICD-10-CM

## 2014-09-30 DIAGNOSIS — D62 Acute posthemorrhagic anemia: Secondary | ICD-10-CM | POA: Diagnosis present

## 2014-09-30 HISTORY — DX: Depression, unspecified: F32.A

## 2014-09-30 HISTORY — DX: Malignant neoplasm of unspecified ovary: C56.9

## 2014-09-30 HISTORY — DX: Chronic obstructive pulmonary disease, unspecified: J44.9

## 2014-09-30 HISTORY — DX: Personal history of other medical treatment: Z92.89

## 2014-09-30 HISTORY — DX: Unspecified chronic bronchitis: J42

## 2014-09-30 HISTORY — DX: Major depressive disorder, single episode, unspecified: F32.9

## 2014-09-30 HISTORY — DX: Heart failure, unspecified: I50.9

## 2014-09-30 HISTORY — DX: Hyperlipidemia, unspecified: E78.5

## 2014-09-30 HISTORY — DX: Rheumatoid arthritis, unspecified: M06.9

## 2014-09-30 HISTORY — DX: Migraine, unspecified, not intractable, without status migrainosus: G43.909

## 2014-09-30 LAB — BRAIN NATRIURETIC PEPTIDE: B Natriuretic Peptide: 1903.7 pg/mL — ABNORMAL HIGH (ref 0.0–100.0)

## 2014-09-30 LAB — COMPREHENSIVE METABOLIC PANEL
ALT: 78 U/L — ABNORMAL HIGH (ref 14–54)
ANION GAP: 8 (ref 5–15)
AST: 46 U/L — ABNORMAL HIGH (ref 15–41)
Albumin: 3.2 g/dL — ABNORMAL LOW (ref 3.5–5.0)
Alkaline Phosphatase: 146 U/L — ABNORMAL HIGH (ref 38–126)
BILIRUBIN TOTAL: 0.8 mg/dL (ref 0.3–1.2)
BUN: 45 mg/dL — AB (ref 6–20)
CALCIUM: 8.5 mg/dL — AB (ref 8.9–10.3)
CHLORIDE: 113 mmol/L — AB (ref 101–111)
CO2: 17 mmol/L — AB (ref 22–32)
CREATININE: 1.83 mg/dL — AB (ref 0.44–1.00)
GFR, EST AFRICAN AMERICAN: 35 mL/min — AB (ref >60)
GFR, EST NON AFRICAN AMERICAN: 30 mL/min — AB (ref >60)
Glucose, Bld: 86 mg/dL (ref 70–99)
POTASSIUM: 5 mmol/L (ref 3.5–5.1)
SODIUM: 138 mmol/L (ref 135–145)
Total Protein: 5.9 g/dL — ABNORMAL LOW (ref 6.5–8.1)

## 2014-09-30 LAB — TSH: TSH: 1.673 u[IU]/mL (ref 0.350–4.500)

## 2014-09-30 LAB — CBC WITH DIFFERENTIAL/PLATELET
Basophils Absolute: 0 10*3/uL (ref 0.0–0.1)
Basophils Relative: 0 % (ref 0–1)
EOS ABS: 0.1 10*3/uL (ref 0.0–0.7)
Eosinophils Relative: 2 % (ref 0–5)
HCT: 28.1 % — ABNORMAL LOW (ref 36.0–46.0)
HEMOGLOBIN: 9 g/dL — AB (ref 12.0–15.0)
LYMPHS ABS: 0.8 10*3/uL (ref 0.7–4.0)
LYMPHS PCT: 20 % (ref 12–46)
MCH: 31.8 pg (ref 26.0–34.0)
MCHC: 32 g/dL (ref 30.0–36.0)
MCV: 99.3 fL (ref 78.0–100.0)
Monocytes Absolute: 0.2 10*3/uL (ref 0.1–1.0)
Monocytes Relative: 5 % (ref 3–12)
NEUTROS ABS: 2.9 10*3/uL (ref 1.7–7.7)
NEUTROS PCT: 73 % (ref 43–77)
PLATELETS: 169 10*3/uL (ref 150–400)
RBC: 2.83 MIL/uL — AB (ref 3.87–5.11)
RDW: 17.3 % — ABNORMAL HIGH (ref 11.5–15.5)
WBC: 3.9 10*3/uL — AB (ref 4.0–10.5)

## 2014-09-30 LAB — I-STAT TROPONIN, ED: Troponin i, poc: 0.03 ng/mL (ref 0.00–0.08)

## 2014-09-30 LAB — TROPONIN I: TROPONIN I: 0.04 ng/mL — AB (ref <0.031)

## 2014-09-30 LAB — MRSA PCR SCREENING: MRSA BY PCR: POSITIVE — AB

## 2014-09-30 MED ORDER — ALBUTEROL SULFATE (2.5 MG/3ML) 0.083% IN NEBU
2.5000 mg | INHALATION_SOLUTION | RESPIRATORY_TRACT | Status: DC | PRN
  Administered 2014-10-01: 2.5 mg via RESPIRATORY_TRACT
  Filled 2014-09-30: qty 3

## 2014-09-30 MED ORDER — ASPIRIN 325 MG PO TABS
325.0000 mg | ORAL_TABLET | Freq: Every day | ORAL | Status: DC
  Administered 2014-09-30 – 2014-10-03 (×4): 325 mg via ORAL
  Filled 2014-09-30 (×4): qty 1

## 2014-09-30 MED ORDER — OXYCODONE-ACETAMINOPHEN 10-325 MG PO TABS: 1.0000 | ORAL_TABLET | ORAL | Status: DC | PRN

## 2014-09-30 MED ORDER — MOMETASONE FURO-FORMOTEROL FUM 200-5 MCG/ACT IN AERO
2.0000 | INHALATION_SPRAY | Freq: Two times a day (BID) | RESPIRATORY_TRACT | Status: DC
  Administered 2014-10-01 – 2014-10-03 (×5): 2 via RESPIRATORY_TRACT
  Filled 2014-09-30 (×2): qty 8.8

## 2014-09-30 MED ORDER — HEPARIN SODIUM (PORCINE) 5000 UNIT/ML IJ SOLN
5000.0000 [IU] | Freq: Three times a day (TID) | INTRAMUSCULAR | Status: DC
  Administered 2014-09-30 – 2014-10-03 (×9): 5000 [IU] via SUBCUTANEOUS
  Filled 2014-09-30 (×10): qty 1

## 2014-09-30 MED ORDER — OXYCODONE HCL 5 MG PO TABS: 5.0000 mg | ORAL_TABLET | ORAL | Status: DC | PRN

## 2014-09-30 MED ORDER — SODIUM CHLORIDE 0.9 % IJ SOLN
3.0000 mL | Freq: Two times a day (BID) | INTRAMUSCULAR | Status: DC
  Administered 2014-09-30 – 2014-10-03 (×6): 3 mL via INTRAVENOUS

## 2014-09-30 MED ORDER — ONDANSETRON HCL 4 MG/2ML IJ SOLN: 4.0000 mg | Freq: Four times a day (QID) | INTRAMUSCULAR | Status: DC | PRN

## 2014-09-30 MED ORDER — NICOTINE 21 MG/24HR TD PT24
21.0000 mg | MEDICATED_PATCH | Freq: Every day | TRANSDERMAL | Status: DC
  Administered 2014-09-30 – 2014-10-03 (×4): 21 mg via TRANSDERMAL
  Filled 2014-09-30 (×4): qty 1

## 2014-09-30 MED ORDER — ENSURE ENLIVE PO LIQD
237.0000 mL | Freq: Two times a day (BID) | ORAL | Status: DC
  Administered 2014-10-01 – 2014-10-03 (×5): 237 mL via ORAL

## 2014-09-30 MED ORDER — OXYCODONE-ACETAMINOPHEN 5-325 MG PO TABS: 1.0000 | ORAL_TABLET | ORAL | Status: DC | PRN

## 2014-09-30 MED ORDER — ATORVASTATIN CALCIUM 20 MG PO TABS: 20.0000 mg | ORAL_TABLET | Freq: Every day | ORAL | Status: DC

## 2014-09-30 MED ORDER — CLONAZEPAM 0.5 MG PO TABS
1.0000 mg | ORAL_TABLET | Freq: Two times a day (BID) | ORAL | Status: DC | PRN
  Administered 2014-10-01 (×2): 1 mg via ORAL
  Filled 2014-09-30 (×3): qty 2

## 2014-09-30 MED ORDER — FUROSEMIDE 10 MG/ML IJ SOLN
40.0000 mg | Freq: Once | INTRAMUSCULAR | Status: AC
  Administered 2014-09-30: 40 mg via INTRAVENOUS
  Filled 2014-09-30: qty 4

## 2014-09-30 MED ORDER — TOPIRAMATE 25 MG PO TABS
50.0000 mg | ORAL_TABLET | Freq: Two times a day (BID) | ORAL | Status: DC
  Administered 2014-09-30 – 2014-10-03 (×6): 50 mg via ORAL
  Filled 2014-09-30 (×7): qty 2

## 2014-09-30 MED ORDER — SODIUM CHLORIDE 0.9 % IV SOLN: 250.0000 mL | INTRAVENOUS | Status: DC | PRN

## 2014-09-30 MED ORDER — SODIUM CHLORIDE 0.9 % IJ SOLN: 3.0000 mL | INTRAMUSCULAR | Status: DC | PRN

## 2014-09-30 MED ORDER — FUROSEMIDE 10 MG/ML IJ SOLN
40.0000 mg | Freq: Two times a day (BID) | INTRAMUSCULAR | Status: DC
  Administered 2014-09-30 – 2014-10-01 (×2): 40 mg via INTRAVENOUS
  Filled 2014-09-30 (×4): qty 4

## 2014-09-30 MED ORDER — ARIPIPRAZOLE 2 MG PO TABS
2.0000 mg | ORAL_TABLET | Freq: Every day | ORAL | Status: DC
  Administered 2014-10-01 – 2014-10-03 (×3): 2 mg via ORAL
  Filled 2014-09-30 (×4): qty 1

## 2014-09-30 MED ORDER — METOPROLOL SUCCINATE 12.5 MG HALF TABLET
12.5000 mg | ORAL_TABLET | Freq: Every day | ORAL | Status: DC
  Administered 2014-09-30 – 2014-10-01 (×2): 12.5 mg via ORAL
  Filled 2014-09-30 (×3): qty 1

## 2014-09-30 MED ORDER — ACETAMINOPHEN 325 MG PO TABS: 650.0000 mg | ORAL_TABLET | ORAL | Status: DC | PRN

## 2014-09-30 MED ORDER — VENLAFAXINE HCL ER 150 MG PO CP24
300.0000 mg | ORAL_CAPSULE | Freq: Every day | ORAL | Status: DC
  Administered 2014-10-01 – 2014-10-03 (×3): 300 mg via ORAL
  Filled 2014-09-30 (×4): qty 2

## 2014-09-30 NOTE — H&P (Signed)
[facility] History and Physical                                                                                    Anne Stevens, is a 54 y.o. female  MRN: [id number]   DOB - May 27, 1961  Admit Date - 09/30/2014  Outpatient Primary MD:  Patient needs a PCP. Oncologist:  Dr. Mia Creek at [facility].   Referring Physician:  Dr. Jeneen Rinks, EDP  Chief Complaint:   Chief Complaint  Patient presents with  . Shortness of Breath     HPI  Anne Stevens  is a 54 y.o. female, with a pmh of CVA, ovarian cancer, rheumatoid arthritis, and bilateral hip replacements. She presents the emergency department today with progressively worsening shortness of breath over the past 3-4 days. [name] has a history of ovarian cancer and was treated with Adriamycin from 2013 - 2015.  She stopped chemotherapy in December 2015, but restarted it again yesterday 09/29/2014. She has no cardiac history. She reports that for the past week she has been short of breath. For the past 4 nights she has had very little sleep having to sit up on the side of the bed every hour to catch her breath area she developed a dry cough approximately 4 days ago. She stopped smoking on Monday, but that did not improve her breathing. She has a 40-pack-year smoking history. She has become so short of breath that she has difficulty speaking in complete sentences.  During chemotherapy yesterday she had a blood transfusion (2 units) that brought her hemoglobin from 7.5-9.0. She had hoped that this would alleviate her shortness of breath but it did not.  Finally she mentions lower extremity edema that started earlier this week as well.  In the emergency department Anne Stevens BNP is 14. Chest x-ray shows small right pleural effusion.  She also has acute renal failure with a creatinine of 1.83, and BUN of 45. Her creatinine was 0.99 2 years ago. Transaminases are mildly elevated.  Chloride is slightly low.  Review of Systems   In addition to the  HPI above,  No Fever-chills, No Headache, No changes with Vision or hearing, No problems swallowing food or Liquids, No Chest pain,  She describes 2 episodes of vomiting on May 2 and a small bout of diarrhea that lasted for 2 or 3 days earlier in the week. No Blood in stool or Urine, No dysuria, No new skin rashes or bruises, No new joints pains-aches,  No new weakness, tingling, numbness in any extremity, No recent weight gain or loss, A full 10 point Review of Systems was done, except as stated above, all other Review of Systems were negative.  Past Medical History  Past Medical History  Diagnosis Date  . Anxiety   . Asthma   . Heart murmur     as a child patient states outgrew   . Stroke     04/2010 , right arm and leg numbness  . Neuromuscular disorder     neuropathy secondary to chemo   . Arthritis     rheumatoid arthritis   . Headache(784.0)     hx of migraines   .  Cancer     ovarian cancer 1975 and 2011  . Anemia     hx of     Past Surgical History  Procedure Laterality Date  . Abdominal hysterectomy    . Colonoscopy    . Right arm surgery     . Joint replacement      right hip replacement 2012   . Total hip arthroplasty  02/05/2012    Procedure: TOTAL HIP ARTHROPLASTY;  Surgeon: Tobi Bastos, MD;  Location: [facility];  Service: Orthopedics;  Laterality: Left;      Social History History  Substance Use Topics  . Smoking status: Current Every Day Smoker -- 1.00 packs/day for 30 years  . Smokeless tobacco: Never Used  . Alcohol Use: No     Comment: occasional    40-pack-year history of smoking. Will drink a glass of wine when she is out at dinner with her husband. Lives at home with her husband is independent with ADLs.  Family History Family History  Problem Relation Age of Onset  . Stroke Mother   . High blood pressure Mother   . High Cholesterol Mother   . Heart Problems Mother   . Rheum arthritis Mother   . Lung cancer Father   . Heart  Problems Father   . Diabetes Father     Prior to Admission medications   Medication Sig Start Date End Date Taking? Authorizing Provider  ADVAIR DISKUS 500-50 MCG/DOSE AEPB Inhale 2 puffs into the lungs 2 (two) times daily as needed. 09/28/14  Yes Historical Provider, MD  ARIPiprazole (ABILIFY) 2 MG tablet Take 2 mg by mouth daily before breakfast.   Yes Historical Provider, MD  aspirin 325 MG tablet Take 325 mg by mouth daily.   Yes Historical Provider, MD  atorvastatin (LIPITOR) 20 MG tablet Take 1 tablet (20 mg total) by mouth daily. 03/25/14  Yes Garvin Fila, MD  clonazePAM (KLONOPIN) 1 MG tablet Take 1 mg by mouth 2 (two) times daily as needed. anxiety   Yes Historical Provider, MD  oxyCODONE-acetaminophen (PERCOCET) 10-325 MG per tablet Take 1 tablet by mouth every 4 (four) hours as needed. Pain 02/08/12  Yes Amber Constable, PA-C  PROAIR HFA 108 (90 BASE) MCG/ACT inhaler Inhale 2 puffs into the lungs every 4 (four) hours as needed. 09/28/14  Yes Historical Provider, MD  topiramate (TOPAMAX) 50 MG tablet Take 50 mg by mouth 2 (two) times daily. Takes 50 mg in the morning and 100 mg at night   Yes Historical Provider, MD  topotecan in sodium chloride 0.9 % 100 mL Inject 1.5 mg/m2 into the vein once a week.   Yes Historical Provider, MD  venlafaxine XR (EFFEXOR-XR) 150 MG 24 hr capsule Take 300 mg by mouth daily before breakfast.   Yes Historical Provider, MD    No Known Allergies  Physical Exam  Vitals  Blood pressure 145/98, pulse 101, temperature 97.6 F (36.4 C), temperature source Oral, resp. rate 16, SpO2 100 %.   General: Well-developed, chronically ill-appearing female lying in bed in NAD, very pleasant.  Psych:  Normal affect and insight, Not Suicidal or Homicidal, Awake Alert, Oriented X 3.  Neuro:   No F.N deficits, ALL C.Nerves Intact, Strength 5/5 all 4 extremities, Sensation intact all 4 extremities.  ENT: Eyes with mild exophthalmos, Conjunctivae clear, PER. Oral  mucosa and lips appear dry  Neck:  Supple, No lymphadenopathy appreciated  Respiratory:  Bilateral crackles noted. Patient has no increased work of breathing. Port-A-Cath  noted in right chest.  Cardiac:  RRR, No Murmurs, mild lower extremity edema noted left side greater than right.  Abdomen:  Positive bowel sounds, Soft, Non tender, Non distended,  No masses appreciated  Skin:  Tanned in color, dry, small bruises noted on arms, no rash or lesions.  Extremities:  Able to move all 4. 5/5 strength in each,  no effusions.  Data Review  CBC  Recent Labs Lab 09/30/14 1232  WBC 3.9*  HGB 9.0*  HCT 28.1*  PLT 169  MCV 99.3  MCH 31.8  MCHC 32.0  RDW 17.3*  LYMPHSABS 0.8  MONOABS 0.2  EOSABS 0.1  BASOSABS 0.0    Chemistries   Recent Labs Lab 09/30/14 1232  NA 138  K 5.0  CL 113*  CO2 17*  GLUCOSE 86  BUN 45*  CREATININE 1.83*  CALCIUM 8.5*  AST 46*  ALT 78*  ALKPHOS 146*  BILITOT 0.8     Urinalysis    Component Value Date/Time   COLORURINE YELLOW 01/18/2011 1500   APPEARANCEUR CLEAR 01/18/2011 1500   LABSPEC 1.023 01/18/2011 1500   PHURINE 5.0 01/18/2011 1500   GLUCOSEU NEGATIVE 01/18/2011 1500   HGBUR NEGATIVE 01/18/2011 1500   BILIRUBINUR SMALL* 01/18/2011 1500   KETONESUR TRACE* 01/18/2011 1500   PROTEINUR NEGATIVE 01/18/2011 1500   UROBILINOGEN 0.2 01/18/2011 1500   NITRITE NEGATIVE 01/18/2011 1500   LEUKOCYTESUR TRACE* 01/18/2011 1500    Imaging results:   Dg Chest 2 View  09/30/2014   CLINICAL DATA:  Shortness of Breath  EXAM: CHEST  2 VIEW  COMPARISON:  01/18/2011  FINDINGS: Borderline cardiomegaly. No pulmonary edema. Right IJ Port-A-Cath with tip in SVC right atrium junction. There is small right pleural effusion with right basilar atelectasis or infiltrate. Mild left basilar atelectasis.  IMPRESSION: Small right pleural effusion or right basilar atelectasis or infiltrate. Small left basilar atelectasis. No pulmonary edema.   Electronically  Signed   By: Lahoma Crocker M.D.   On: 09/30/2014 13:36    My personal review of EKG: NSR, QTC is not prolonged.   Assessment & Plan  Principal Problem:   CHF (congestive heart failure) Active Problems:   Acute renal failure   Acute blood loss anemia   H/O stroke associated with blood clotting tendency   Right hemiparesis   Aphasia   Acute heart failure   Acute heart failure New diagnosis. Given orthopnea, dry cough, dyspnea, elevated BNP, and chest x-ray results. Patient was on Adriamycin chemotherapy but has now been started on a new regimen. We'll check a 2-D echocardiogram and diurese with IV Lasix. Check troponin 1. We'll start metoprolol succinate 12.5 mg daily. Patient cannot tolerate an ACE inhibitor as her creatinine is elevated. She needs prompt hospital follow-up with a primary care physician regarding ongoing management of her heart failure. I have asked case management to assist with obtaining a hospital follow-up appointment.  Acute renal failure Possibly secondary to CHF or new chemotherapy regimen. This may have been exacerbated by recent vomiting and diarrhea. I do not see any suspicious medications. Need to diurese patient carefully while monitoring her creatinine.  Acute blood loss anemia Received 2 units of packed red blood cells today at chemotherapy.  Lower extremity edema left greater than right Will check lower extremity Dopplers to rule out DVT given patient's cancer history.  History of CVA Continue 325 mg aspirin Patient has residual right arm numbness and mild aphasia.  Hyperlipidemia Will hold statin as patient's LFTs are mildly elevated.  Recurrent Ovarian Cancer Now with mets to liver and colon (per patient's report). Chemo was stopped in 04/2014, but now with new growth of tumor chemo was restarted again 5/3.  Tobacco abuse. 40 pack year history.  Quit Monday. Nicotine patch ordered.    Consultants Called:  None  Family  Communication:   Patient is alert oriented and understands her plan of care.  Code Status:  Full code  Condition:  Guarded  Potential Disposition: To home in 24-48 hours.  Time spent in minutes : [address]  PA-C on 09/30/2014 at 5:47 PM Between 7am to 7pm - Pager - [phone] After 7pm go to [email or web address] - password [email or web address] And look for the night coverage person covering me after hours  [facility]

## 2014-09-30 NOTE — ED Notes (Addendum)
SOB worse at night and when lies down flat in bed. Reports coughing more than usual. Stopped smoking Monday due to this issue. Hx of COPD. Got inhaler and advair Monday; doesn't seem to be helping. Receiving chemo currently for ovarian cancer (2011 recured in January 2015). Got 2 pints blood for hmg 7 at [facility] after chemo.

## 2014-09-30 NOTE — ED Notes (Signed)
Attempted to call report

## 2014-09-30 NOTE — ED Provider Notes (Signed)
CSN: [id number]     Arrival date & time 09/30/14  1219 History   First MD Initiated Contact with Patient 09/30/14 1247     Chief Complaint  Patient presents with  . Shortness of Breath      HPI  Patient presents for evaluation of dyspnea. Significant history of ovarian cancer. Originally diagnosed 2011 with hysterectomy years chemotherapy. Recurred 2013. Chemotherapy 2013 through December 2015. This did include Adriamycin. She does not know her total dosing of her Adriamycin. Has never had cardiac abnormalities.  Resume chemotherapy yesterday. Had outpatient labs yesterday showing a hemoglobin of 7.5 and was given 2 units of packed red blood cells. Continues to have dyspnea and has so for the last 2 weeks progressive. Now at rest. Worse at night with frank orthopnea. No chest pain. Minimal cough. No hemoptysis or sputum production no fever no chest pain.  30+ pack year smoking history states she "quit Monday".  Is given inhaler by a physician at urgent care center 2 days ago because of her dyspnea.  Also notes progressive leg swelling over the last week.  Past Medical History  Diagnosis Date  . Anxiety   . Asthma   . Heart murmur     as a child patient states outgrew   . Stroke     04/2010 , right arm and leg numbness  . Neuromuscular disorder     neuropathy secondary to chemo   . Arthritis     rheumatoid arthritis   . Headache(784.0)     hx of migraines   . Cancer     ovarian cancer 1975 and 2011  . Anemia     hx of    Past Surgical History  Procedure Laterality Date  . Abdominal hysterectomy    . Colonoscopy    . Right arm surgery     . Joint replacement      right hip replacement 2012   . Total hip arthroplasty  02/05/2012    Procedure: TOTAL HIP ARTHROPLASTY;  Surgeon: Tobi Bastos, MD;  Location: [facility];  Service: Orthopedics;  Laterality: Left;   Family History  Problem Relation Age of Onset  . Stroke Mother   . High blood pressure Mother   . High  Cholesterol Mother   . Heart Problems Mother   . Rheum arthritis Mother   . Lung cancer Father   . Heart Problems Father   . Diabetes Father    History  Substance Use Topics  . Smoking status: Current Every Day Smoker -- 1.00 packs/day for 30 years  . Smokeless tobacco: Never Used  . Alcohol Use: No     Comment: occasional    OB History    No data available     Review of Systems  Constitutional: Negative for fever, chills, diaphoresis, appetite change and fatigue.  HENT: Negative for mouth sores, sore throat and trouble swallowing.   Eyes: Negative for visual disturbance.  Respiratory: Positive for shortness of breath. Negative for cough, chest tightness and wheezing.   Cardiovascular: Positive for chest pain and leg swelling.       PND, orthopnea. Leg swelling.  Gastrointestinal: Negative for nausea, vomiting, abdominal pain, diarrhea and abdominal distention.  Endocrine: Negative for polydipsia, polyphagia and polyuria.  Genitourinary: Negative for dysuria, frequency and hematuria.  Musculoskeletal: Negative for gait problem.  Skin: Negative for color change, pallor and rash.  Neurological: Negative for dizziness, syncope, light-headedness and headaches.  Hematological: Does not bruise/bleed easily.  Psychiatric/Behavioral: Negative for behavioral  problems and confusion.      Allergies  Review of patient's allergies indicates no known allergies.  Home Medications   Prior to Admission medications   Medication Sig Start Date End Date Taking? Authorizing Provider  ADVAIR DISKUS 500-50 MCG/DOSE AEPB Inhale 2 puffs into the lungs 2 (two) times daily as needed. 09/28/14  Yes Historical Provider, MD  ARIPiprazole (ABILIFY) 2 MG tablet Take 2 mg by mouth daily before breakfast.   Yes Historical Provider, MD  aspirin 325 MG tablet Take 325 mg by mouth daily.   Yes Historical Provider, MD  atorvastatin (LIPITOR) 20 MG tablet Take 1 tablet (20 mg total) by mouth daily. 03/25/14   Yes Garvin Fila, MD  clonazePAM (KLONOPIN) 1 MG tablet Take 1 mg by mouth 2 (two) times daily as needed. anxiety   Yes Historical Provider, MD  oxyCODONE-acetaminophen (PERCOCET) 10-325 MG per tablet Take 1 tablet by mouth every 4 (four) hours as needed. Pain 02/08/12  Yes Amber Constable, PA-C  PROAIR HFA 108 (90 BASE) MCG/ACT inhaler Inhale 2 puffs into the lungs every 4 (four) hours as needed. 09/28/14  Yes Historical Provider, MD  topiramate (TOPAMAX) 50 MG tablet Take 50 mg by mouth 2 (two) times daily. Takes 50 mg in the morning and 100 mg at night   Yes Historical Provider, MD  topotecan in sodium chloride 0.9 % 100 mL Inject 1.5 mg/m2 into the vein once a week.   Yes Historical Provider, MD  venlafaxine XR (EFFEXOR-XR) 150 MG 24 hr capsule Take 300 mg by mouth daily before breakfast.   Yes Historical Provider, MD   BP 145/100 mmHg  Pulse 96  Temp(Src) 97.6 F (36.4 C) (Oral)  Resp 20  SpO2 94% Physical Exam  Constitutional: She is oriented to person, place, and time. She appears well-developed and well-nourished. No distress.  HENT:  Head: Normocephalic.  Eyes: Conjunctivae are normal. Pupils are equal, round, and reactive to light. No scleral icterus.  Neck: Normal range of motion. Neck supple. JVD present. No thyromegaly present.  JVD sitting upright.  Cardiovascular: Normal rate and regular rhythm.  Exam reveals no gallop and no friction rub.   No murmur heard. Sinus rhythm. S4 gallop. 2/6 holosystolic murmur.  Pulmonary/Chest: Effort normal and breath sounds normal. No respiratory distress. She has no wheezes. She has no rales.  Minutes bibasilar breath sounds, right greater than left with crackles at the right midlung.  Abdominal: Soft. Bowel sounds are normal. She exhibits no distension. There is no tenderness. There is no rebound.  Musculoskeletal: Normal range of motion.  Neurological: She is alert and oriented to person, place, and time.  Skin: Skin is warm and dry. No  rash noted.  Bilateral symmetric lower extremity edema without erythema cording or pain.  Psychiatric: She has a normal mood and affect. Her behavior is normal.    ED Course  Procedures (including critical care time) Labs Review Labs Reviewed  CBC WITH DIFFERENTIAL/PLATELET - Abnormal; Notable for the following:    WBC 3.9 (*)    RBC 2.83 (*)    Hemoglobin 9.0 (*)    HCT 28.1 (*)    RDW 17.3 (*)    All other components within normal limits  COMPREHENSIVE METABOLIC PANEL - Abnormal; Notable for the following:    Chloride 113 (*)    CO2 17 (*)    BUN 45 (*)    Creatinine, Ser 1.83 (*)    Calcium 8.5 (*)    Total Protein 5.9 (*)  Albumin 3.2 (*)    AST 46 (*)    ALT 78 (*)    Alkaline Phosphatase 146 (*)    GFR calc non Af Amer 30 (*)    GFR calc Af Amer 35 (*)    All other components within normal limits  BRAIN NATRIURETIC PEPTIDE - Abnormal; Notable for the following:    B Natriuretic Peptide 1903.7 (*)    All other components within normal limits  I-STAT TROPOININ, ED    Imaging Review Dg Chest 2 View  09/30/2014   CLINICAL DATA:  Shortness of Breath  EXAM: CHEST  2 VIEW  COMPARISON:  01/18/2011  FINDINGS: Borderline cardiomegaly. No pulmonary edema. Right IJ Port-A-Cath with tip in SVC right atrium junction. There is small right pleural effusion with right basilar atelectasis or infiltrate. Mild left basilar atelectasis.  IMPRESSION: Small right pleural effusion or right basilar atelectasis or infiltrate. Small left basilar atelectasis. No pulmonary edema.   Electronically Signed   By: Lahoma Crocker M.D.   On: 09/30/2014 13:36     EKG Interpretation None      CT 4/28 at [facility]:  FINDINGS:  CHEST:  .Chest wall/thoracic inlet: Within normal limits.   .Mediastinum/hila: Scattered lymph nodes in the mediastinum more conspicuous in number than size. Overall these appear similar to prior imaging.  Marland KitchenHeart/vessels: Right-sided port catheter terminates in the  right atrium. Coronary artery disease. Hepatomegaly. Low-attenuation ventricular blood pool suggesting anemia. Small pericardial effusion. Enlarged main pulmonary artery.  .Lungs: Interstitial edema in the lungs. Perifissural nodule right upper lobe.  .Pleura: Bilateral pleural effusions.    MDM   Final diagnoses:  Acute congestive heart failure, unspecified congestive heart failure type    Patient with progressive dyspnea, lower extremity edema, right pleural effusion, PND, orthopnea, gallop, elevated BNP. Recent CT scan showing interstitial fluid. History of doxorubicin chemotherapy. Also noted to have coronary artery disease on CT of the chest. She has worsened to the point she is unable to lay some fine. I have placed a call hospitalist regarding admission. Given IV Lasix here.    Tanna Furry, MD 09/30/14 [phone]

## 2014-09-30 NOTE — Progress Notes (Signed)
Notified Dr. Curly Rim that pt has arrived from the ED.  Verneda Hollopeter,RN.

## 2014-09-30 NOTE — ED Notes (Signed)
Admitting at bedside 

## 2014-10-01 ENCOUNTER — Inpatient Hospital Stay (HOSPITAL_COMMUNITY): Payer: Medicare Other

## 2014-10-01 DIAGNOSIS — D649 Anemia, unspecified: Secondary | ICD-10-CM

## 2014-10-01 DIAGNOSIS — Z720 Tobacco use: Secondary | ICD-10-CM

## 2014-10-01 DIAGNOSIS — E43 Unspecified severe protein-calorie malnutrition: Secondary | ICD-10-CM | POA: Insufficient documentation

## 2014-10-01 DIAGNOSIS — I5021 Acute systolic (congestive) heart failure: Secondary | ICD-10-CM

## 2014-10-01 DIAGNOSIS — R0600 Dyspnea, unspecified: Secondary | ICD-10-CM

## 2014-10-01 DIAGNOSIS — I340 Nonrheumatic mitral (valve) insufficiency: Secondary | ICD-10-CM

## 2014-10-01 DIAGNOSIS — I5041 Acute combined systolic (congestive) and diastolic (congestive) heart failure: Principal | ICD-10-CM

## 2014-10-01 DIAGNOSIS — N179 Acute kidney failure, unspecified: Secondary | ICD-10-CM

## 2014-10-01 DIAGNOSIS — R609 Edema, unspecified: Secondary | ICD-10-CM

## 2014-10-01 LAB — BASIC METABOLIC PANEL WITH GFR
Anion gap: 8 (ref 5–15)
BUN: 55 mg/dL — ABNORMAL HIGH (ref 6–20)
CO2: 19 mmol/L — ABNORMAL LOW (ref 22–32)
Calcium: 8.3 mg/dL — ABNORMAL LOW (ref 8.9–10.3)
Chloride: 112 mmol/L — ABNORMAL HIGH (ref 101–111)
Creatinine, Ser: 1.94 mg/dL — ABNORMAL HIGH (ref 0.44–1.00)
GFR calc Af Amer: 32 mL/min — ABNORMAL LOW
GFR calc non Af Amer: 28 mL/min — ABNORMAL LOW
Glucose, Bld: 104 mg/dL — ABNORMAL HIGH (ref 70–99)
Potassium: 4.8 mmol/L (ref 3.5–5.1)
Sodium: 139 mmol/L (ref 135–145)

## 2014-10-01 LAB — TSH: TSH: 1.771 u[IU]/mL (ref 0.350–4.500)

## 2014-10-01 LAB — TROPONIN I
TROPONIN I: 0.03 ng/mL (ref <0.031)
Troponin I: 0.04 ng/mL — ABNORMAL HIGH (ref <0.031)

## 2014-10-01 MED ORDER — HYDRALAZINE HCL 25 MG PO TABS
25.0000 mg | ORAL_TABLET | Freq: Three times a day (TID) | ORAL | Status: DC
  Administered 2014-10-01 – 2014-10-03 (×7): 25 mg via ORAL
  Filled 2014-10-01 (×10): qty 1

## 2014-10-01 MED ORDER — ISOSORBIDE MONONITRATE ER 30 MG PO TB24
30.0000 mg | ORAL_TABLET | Freq: Every day | ORAL | Status: DC
Start: 2014-10-01 — End: 2014-10-03
  Administered 2014-10-01 – 2014-10-03 (×3): 30 mg via ORAL
  Filled 2014-10-01 (×3): qty 1

## 2014-10-01 MED ORDER — ATORVASTATIN CALCIUM 20 MG PO TABS
20.0000 mg | ORAL_TABLET | Freq: Every day | ORAL | Status: DC
  Administered 2014-10-01 – 2014-10-02 (×2): 20 mg via ORAL
  Filled 2014-10-01 (×3): qty 1

## 2014-10-01 MED ORDER — MUPIROCIN 2 % EX OINT
1.0000 "application " | TOPICAL_OINTMENT | Freq: Two times a day (BID) | CUTANEOUS | Status: DC
  Administered 2014-10-01 – 2014-10-03 (×6): 1 via NASAL
  Filled 2014-10-01: qty 22

## 2014-10-01 MED ORDER — FUROSEMIDE 10 MG/ML IJ SOLN
40.0000 mg | Freq: Three times a day (TID) | INTRAMUSCULAR | Status: DC
  Administered 2014-10-01 – 2014-10-02 (×2): 40 mg via INTRAVENOUS
  Filled 2014-10-01 (×3): qty 4

## 2014-10-01 MED ORDER — FUROSEMIDE 10 MG/ML IJ SOLN
40.0000 mg | Freq: Three times a day (TID) | INTRAMUSCULAR | Status: DC
  Administered 2014-10-01: 40 mg via INTRAVENOUS
  Filled 2014-10-01: qty 4

## 2014-10-01 MED ORDER — CHLORHEXIDINE GLUCONATE CLOTH 2 % EX PADS
6.0000 | MEDICATED_PAD | Freq: Every day | CUTANEOUS | Status: DC
  Administered 2014-10-01 – 2014-10-03 (×3): 6 via TOPICAL

## 2014-10-01 MED ORDER — BOOST / RESOURCE BREEZE PO LIQD
1.0000 | ORAL | Status: DC
  Administered 2014-10-02 – 2014-10-03 (×2): 1 via ORAL

## 2014-10-01 MED ORDER — CLONAZEPAM 0.5 MG PO TABS
1.0000 mg | ORAL_TABLET | Freq: Three times a day (TID) | ORAL | Status: DC | PRN
  Administered 2014-10-01 – 2014-10-02 (×3): 1 mg via ORAL
  Filled 2014-10-01 (×3): qty 2

## 2014-10-01 NOTE — Progress Notes (Signed)
*  Preliminary Results* Bilateral lower extremity venous duplex completed. Bilateral lower extremities are negative for deep vein thrombosis. There is no evidence of Baker's cyst bilaterally.  10/01/2014  Maudry Mayhew, RVT, RDCS, RDMS

## 2014-10-01 NOTE — Progress Notes (Signed)
PROGRESS NOTE    Anne Stevens TKZ:[id number] DOB: 1961/05/08 DOA: 09/30/2014 PCP: No PCP Per Patient  Primary Oncologist: Dr. 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 Leonie Man, neurology), HLD, ongoing tobacco abuse, depression, recurrent ovarian cancer on treatment with Adriamycin from 2013-2015-stopped chemotherapy in December 2015 and resumed again on 09/29/14 and anemia presented to [facility] ED on 09/30/14 with complaints of progressively worsening shortness of breath, orthopnea, LE edema and dry cough over the past 3-4 days PTA. She has no cardiac history. During chemotherapy 5/3, she had a blood transfusion (2 units) that brought her hemoglobin from 7.5-9.0. In the ED, BNP 1903, chest x-ray showed small right pleural effusion, creatinine1.83 which was 0.99 about 2 years ago. Patient admitted for acute heart failure.   Assessment/Plan:  Acute combined systolic and diastolic CHF. - Patient denies prior cardiac or CHF history. - New diagnosis. - Apparently had been on Adriamycin but now started on a new regimen. - Started on Lasix 40 mg IV every 12 hours on admission - -735 ML since admission but patient states no significant difference in dyspnea or leg edema. May need to increase Lasix dose. - Low-dose metoprolol initiated on admission. - Not started on ACEI/ARB secondary to renal failure. - 2-D echo 5/5: LVEF 25-30 percent, severely reduced LV systolic function with diffuse hypokinesis and grade 2 diastolic dysfunction. Minimally elevated troponin of 0.04 (will cycle). - Cardiology consulted on 5/5 for further evaluation and management. 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 Stress test  Acute renal failure versus acute on chronic kidney disease - Creatinine on  02/07/12:0.99. No further results until this hospitalization when creatinine 1.83. She may have progressed in the interim or this may be all acute. - Try to obtain lab results from oncologist. - Will get renal ultrasound to rule out obstruction related to ovarian cancer. - Monitor BMP closely while on diuretics.  Chronic anemia - Hemoglobin in 2013 was in the 9 g range. - Status post 2 units of PRBC transfusion across chemotherapy on 5/3. - Hemoglobin on admission: 9. - Chronic anemia probably related to underlying malignancy and chronic disease. Acute drop may have been from chemotherapy. - Follow CBCs.  Lower extremity edema left greater than right - Will check lower extremity Dopplers to rule out DVT given patient's cancer history.  History of CVA-remote left MCA branch infarct  - Continue 325 mg aspirin - Patient has residual right arm numbness and mild aphasia. - Follows with Dr. Leonie Man at [facility] neurology.  Hyperlipidemia - Will hold statin as patient's LFTs are mildly elevated.  Recurrent Ovarian Cancer - Now with mets to liver and colon (per patient's report). - Chemo was stopped in 04/2014, but now with new growth of tumor chemo was restarted again 5/3.  Tobacco abuse. - 40 pack year history. Quit Monday PTA. - Nicotine patch ordered.  Depression - Stable without suicidal or homicidal ideations  Severe mitral regurgitation by echo    Code Status: Full Family Communication: None at bedside Disposition Plan: DC home when medically stable   Consultants:  Cardiology  Procedures:  2-D echo 10/01/14: Study Conclusions  - Left ventricle: The cavity size was moderately dilated. Wall thickness was normal. Systolic function was severely reduced. The estimated ejection fraction was in the range of 25% to 30%. Diffuse hypokinesis. Features  are consistent with a pseudonormal left ventricular filling pattern, with concomitant abnormal relaxation and  increased filling pressure (grade 2 diastolic dysfunction). E/medial e&' > 15 suggests LV end diastolic pressure at least 20 mmHg. - Aortic valve: There was no stenosis. There was trivial regurgitation. - Mitral valve: There was severe central mitral regurgitation that appeared to be due to annular dilatation. - Left atrium: The atrium was moderately dilated. - Right ventricle: The cavity size was normal. Systolic function was mildly reduced. - Pulmonary arteries: PA peak pressure: 42 mm Hg (S). - Systemic veins: IVC measured 2.3 cm with < 50% respirophasic variation, suggesting RA pressure 15 mmHg.  Impressions:  - Moderately dilated LV with EF 25-30%, diffuse hypokinesis. Moderate diastolic dysfunction with evidence for elevated LV filling pressure. Normal RV size with mildly reduced systolic function. There was mild pulmonary hypertension. Dilated IVC suggesting elevated RV filling pressure. There was severe central MR that appeared most likely due to annular dilatation.   Antibiotics:  None   Subjective: Patient states that she does not see significant difference in dyspnea, leg edema or urine output.  Objective: Filed Vitals:   09/30/14 1700 10/01/14 0254 10/01/14 0524 10/01/14 1334  BP: 151/98 128/82 140/90 126/87  Pulse: 107 89 97 91  Temp: 97.8 F (36.6 C) 98 F (36.7 C) 98.2 F (36.8 C) 98.2 F (36.8 C)  TempSrc: Oral Oral Oral Oral  Resp: 18 20 19 20   Height: 5\' 9"  (1.753 m)     Weight: 66.86 kg (147 lb 6.4 oz)  61.19 kg (134 lb 14.4 oz)   SpO2: 99% 95% 97% 100%    Intake/Output Summary (Last 24 hours) at 10/01/14 1400 Last data filed at 10/01/14 0900  Gross per 24 hour  Intake    540 ml  Output   1275 ml  Net   -735 ml   Filed Weights   09/30/14 1700 10/01/14 0524  Weight: 66.86 kg (147 lb 6.4 oz) 61.19 kg (134 lb 14.4 oz)     Exam:  General exam: Moderately built and nourished middle-aged female lying comfortably propped  up in bed. Respiratory system: Reduced breath sounds in the bases with few bibasal crackles. Rest of lung fields clear to auscultation. No increased work of breathing. Cardiovascular system: S1 & S2 heard, RRR. No JVD, murmurs, gallops, clicks. Trace bilateral leg edema. Telemetry: Sinus rhythm. Gastrointestinal system: Abdomen is nondistended, soft and nontender. Normal bowel sounds heard. Central nervous system: Alert and oriented. No focal neurological deficits. Extremities: Symmetric 5 x 5 power.   Data Reviewed: Basic Metabolic Panel:  Recent Labs Lab 09/30/14 1232 10/01/14 0353  NA 138 139  K 5.0 4.8  CL 113* 112*  CO2 17* 19*  GLUCOSE 86 104*  BUN 45* 55*  CREATININE 1.83* 1.94*  CALCIUM 8.5* 8.3*   Liver Function Tests:  Recent Labs Lab 09/30/14 1232  AST 46*  ALT 78*  ALKPHOS 146*  BILITOT 0.8  PROT 5.9*  ALBUMIN 3.2*   No results for input(s): LIPASE, AMYLASE in the last 168 hours. No results for input(s): AMMONIA in the last 168 hours. CBC:  Recent Labs Lab 09/30/14 1232  WBC 3.9*  NEUTROABS 2.9  HGB 9.0*  HCT 28.1*  MCV 99.3  PLT 169   Cardiac Enzymes:  Recent Labs Lab 09/30/14 1852  TROPONINI 0.04*   BNP (last 3 results) No results for input(s): PROBNP in the last 8760 hours. CBG: No results for input(s): GLUCAP in the last 168 hours.  Recent  Results (from the past 240 hour(s))  MRSA PCR Screening     Status: Abnormal   Collection Time: 09/30/14  7:00 PM  Result Value Ref Range Status   MRSA by PCR POSITIVE (A) NEGATIVE Final    Comment:        The GeneXpert MRSA Assay (FDA approved for NASAL specimens only), is one component of a comprehensive MRSA colonization surveillance program. It is not intended to diagnose MRSA infection nor to guide or monitor treatment for MRSA infections. RESULT CALLED TO, READ BACK BY AND VERIFIED WITH: Royetta Asal RN [id number] 09/30/14 A BROWNING         Studies: Dg Chest 2 View  09/30/2014    CLINICAL DATA:  Shortness of Breath  EXAM: CHEST  2 VIEW  COMPARISON:  01/18/2011  FINDINGS: Borderline cardiomegaly. No pulmonary edema. Right IJ Port-A-Cath with tip in SVC right atrium junction. There is small right pleural effusion with right basilar atelectasis or infiltrate. Mild left basilar atelectasis.  IMPRESSION: Small right pleural effusion or right basilar atelectasis or infiltrate. Small left basilar atelectasis. No pulmonary edema.   Electronically Signed   By: Lahoma Crocker M.D.   On: 09/30/2014 13:36        Scheduled Meds: . ARIPiprazole  2 mg Oral QAC breakfast  . aspirin  325 mg Oral Daily  . Chlorhexidine Gluconate Cloth  6 each Topical Q0600  . feeding supplement (ENSURE ENLIVE)  237 mL Oral BID BM  . furosemide  40 mg Intravenous BID  . heparin  5,000 Units Subcutaneous 3 times per day  . metoprolol succinate  12.5 mg Oral Daily  . mometasone-formoterol  2 puff Inhalation BID  . mupirocin ointment  1 application Nasal BID  . nicotine  21 mg Transdermal Daily  . sodium chloride  3 mL Intravenous Q12H  . topiramate  50 mg Oral BID  . venlafaxine XR  300 mg Oral QAC breakfast   Continuous Infusions:   Principal Problem:   CHF (congestive heart failure) Active Problems:   Acute blood loss anemia   H/O stroke associated with blood clotting tendency   Right hemiparesis   Aphasia   Acute renal failure   Acute heart failure   Dyspnea   Bilateral edema of lower extremity   Ovarian cancer    Time spent: 45 minutes.    Vernell Leep, MD, FACP, FHM. [facility] Pager [phone]  If 7PM-7AM, please contact night-coverage [email or web address] Password [email or web address] 10/01/2014, 2:00 PM    LOS: 1 day

## 2014-10-01 NOTE — Consult Note (Signed)
CARDIOLOGY CONSULT NOTE  Patient ID: Anne Stevens MRN: [id number] DOB/AGE: [birth year] 54 y.o.  Admit date: 09/30/2014 Primary Physician: None Primary Cardiologist: Luanna Cole Reason for Consultation: CHF  HPI:  54 yo with history of ovarian cancer with recurrence, CVA 12/11, COPD/smoking, CKD presented with dyspnea and was found to have CHF exacerbation.  Patient's ovarian cancer was diagnosed in 2013.  She received liposomal doxorubicin from 4/15 to 12/15.  Echo in 4/15 showed normal LV systolic function (EF 40-98%).  She was noted to have recurrence with worsening peritoneal disease recently and received her first cycle of topotecan on 09/29/14.  She has been short of breath for about 3 weeks. Initially, she thought this was due to anemia and had a transfusion earlier this week.  However, the transfusion did not help her breathing.  She has been short of breath just walking around her house.  She has had orthopnea and episodes of PND.  +Bendopnea.  No lightheadedness/syncope, no palpitations, no chest pain.  She feels bloated.   She came to ER here yesterday.  BNP was elevated and she was very short of breath.  She was treated for CHF exacerbation with Lasix 40 mg IV bid.  She says that her breathing is improving. Echo was done today, showing EF 25-30% with severe central MR.   Review of systems complete and found to be negative unless listed above in HPI  Past Medical History: 1. Ovarian cancer: Diagnosed in 2013, has recurred more recently with worsening peritoneal disease.   - Initial chemo with Taxol, carboplatin and later Gemzar.  - She had liposomal doxorubicin from 4/15 to 12/15.   - Topotecan began with initial dose on 09/29/14.  2. Hyperlipidemia 3. 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   4. COPD: Active smoker. 5. CKD: Creatinine ranging 1.9-2.2 over the last few months.  6. Depression 7. Rheumatoid arthritis 8. Bilateral THR 9. Anemia: Related to chemotherapy.  10.  Cardiomyopathy: Echo (4/15) at [facility] with EF 55-60%.  Echo (5/16) with EF 25-30%, moderately dilated LV, moderate diastolic dysfunction, normal RV size with mildly decreased systolic function, severe central MR likely due to annular dilatation.   Family History  Problem Relation Age of Onset  . Stroke Mother   . High blood pressure Mother   . High Cholesterol Mother   . Heart Problems Mother   . Rheum arthritis Mother   . Lung cancer Father   . Heart Problems Father   . Diabetes Father    Brother with CABG at 47, mother with PCI around age 39, father with PCI  History   Social History  . Marital Status: Married    Spouse Name: Charlotte Crumb  . Number of Children: 0  . Years of Education: HS   Occupational History  . Retired    Social History Main Topics  . Smoking status: Former Smoker -- 1.00 packs/day for 40 years    Types: Cigarettes    Quit date: 09/28/2014  . Smokeless tobacco: Never Used  . Alcohol Use: Yes     Comment: 09/30/2014 "glass of wine or couple beers/month"  . Drug Use: Yes    Special: Marijuana, Codeine     Comment: Marijuana   . Sexual Activity: Not Currently   Other Topics Concern  . Not on file   Social History Narrative   Patient lives at home with spouse.   Patient is right handed.   Patient has 12 th grade education.   Caffeine Use:  2 cups daily     Prescriptions prior to admission  Medication Sig Dispense Refill Last Dose  . ADVAIR DISKUS 500-50 MCG/DOSE AEPB Inhale 2 puffs into the lungs 2 (two) times daily as needed.   09/29/2014 at Unknown time  . ARIPiprazole (ABILIFY) 2 MG tablet Take 2 mg by mouth daily before breakfast.   09/30/2014 at Unknown time  . aspirin 325 MG tablet Take 325 mg by mouth daily.   09/29/2014 at Unknown time  . atorvastatin (LIPITOR) 20 MG tablet Take 1 tablet (20 mg total) by mouth daily. 60 tablet 3 09/30/2014 at Unknown time  . clonazePAM (KLONOPIN) 1 MG tablet Take 1 mg by mouth 2 (two) times daily as needed. anxiety    09/30/2014 at Unknown time  . oxyCODONE-acetaminophen (PERCOCET) 10-325 MG per tablet Take 1 tablet by mouth every 4 (four) hours as needed. Pain 60 tablet 0 09/30/2014 at Unknown time  . PROAIR HFA 108 (90 BASE) MCG/ACT inhaler Inhale 2 puffs into the lungs every 4 (four) hours as needed.   09/29/2014 at Unknown time  . topiramate (TOPAMAX) 50 MG tablet Take 50 mg by mouth 2 (two) times daily. Takes 50 mg in the morning and 100 mg at night   09/30/2014 at Unknown time  . topotecan in sodium chloride 0.9 % 100 mL Inject 1.5 mg/m2 into the vein once a week.   Past Month at Unknown time  . venlafaxine XR (EFFEXOR-XR) 150 MG 24 hr capsule Take 300 mg by mouth daily before breakfast.   09/30/2014 at Unknown time   Current Scheduled Meds: . ARIPiprazole  2 mg Oral QAC breakfast  . aspirin  325 mg Oral Daily  . atorvastatin  20 mg Oral q1800  . Chlorhexidine Gluconate Cloth  6 each Topical Q0600  . feeding supplement (ENSURE ENLIVE)  237 mL Oral BID BM  . furosemide  40 mg Intravenous Q8H  . heparin  5,000 Units Subcutaneous 3 times per day  . hydrALAZINE  25 mg Oral 3 times per day  . isosorbide mononitrate  30 mg Oral Daily  . metoprolol succinate  12.5 mg Oral Daily  . mometasone-formoterol  2 puff Inhalation BID  . mupirocin ointment  1 application Nasal BID  . nicotine  21 mg Transdermal Daily  . sodium chloride  3 mL Intravenous Q12H  . topiramate  50 mg Oral BID  . venlafaxine XR  300 mg Oral QAC breakfast   Continuous Infusions:  PRN Meds:.sodium chloride, acetaminophen, albuterol, clonazePAM, ondansetron (ZOFRAN) IV, oxyCODONE-acetaminophen **AND** oxyCODONE, sodium chloride   Physical exam Blood pressure 126/87, pulse 91, temperature 98.2 F (36.8 C), temperature source Oral, resp. rate 20, height 5\' 9"  (1.753 m), weight 134 lb 14.4 oz (61.19 kg), SpO2 100 %. General: NAD Neck: JVP 9-10 cm, no thyromegaly or thyroid nodule.  Lungs: Crackles at bases bilaterally CV: Nondisplaced PMI.   Heart mildly tachy, regular S1/S2, no S3/S4, 3/6 HSM apex.  Trace ankle edema.  No carotid bruit.  Normal pedal pulses.  Abdomen: Soft, mild RUQ tenderness, no hepatosplenomegaly, no distention.  Skin: Intact without lesions or rashes.  Neurologic: Alert and oriented x 3.  Psych: Normal affect. Extremities: No clubbing or cyanosis.  HEENT: Normal.   Labs:   Lab Results  Component Value Date   WBC 3.9* 09/30/2014   HGB 9.0* 09/30/2014   HCT 28.1* 09/30/2014   MCV 99.3 09/30/2014   PLT 169 09/30/2014    Recent Labs Lab 09/30/14 1232 10/01/14 0353  NA 138 139  K 5.0 4.8  CL 113* 112*  CO2 17* 19*  BUN 45* 55*  CREATININE 1.83* 1.94*  CALCIUM 8.5* 8.3*  PROT 5.9*  --   BILITOT 0.8  --   ALKPHOS 146*  --   ALT 78*  --   AST 46*  --   GLUCOSE 86 104*   Lab Results  Component Value Date   CKTOTAL 52 05/17/2010   CKMB 1.8 05/17/2010   TROPONINI 0.04* 09/30/2014  BNP 1904   Radiology: - CXR: Small right effusion  EKG: NSR, nonspecific lateral T wave flattening  ASSESSMENT AND PLAN: 54 yo with history of ovarian cancer with recurrence, CVA 12/11, COPD/smoking, CKD presented with dyspnea and was found to have CHF exacerbation. 1. Acute systolic CHF: I reviewed echo today.  EF 25-30%, moderately dilated LV, severe central MR likely functional from annular dilatation.  She has had symptoms for about 3 weeks or so.  No particular trigger.  No chest pain.  No known prior cardiac disease.  She is volume overloaded on exam but not markedly.  She has started feeling better with diuresis.  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  Echo prior to doxorubicin initiation in 4/15 showed normal EF.  Cannot rule out ischemic disease given her risk factors (known vascular disease with prior CVA, hyperlipidemia, active smoking).  However, would not do cardiac cath at this time given CKD.  - Continue Lasix at 40 mg IV every 8 hrs for now.  Follow creatinine  closely.  - No ACEI with elevated creatinine.  I will start afterload reduction with hydralazine 25 mg tid + imdur 30.  - Continue current Toprol XL.  Given COPD, I may eventually transition this to beta-1 selective bisoprolol.  2. Mitral regurgitation: Severe central MR.  Probably functional from annular dilatation.  Treat CHF with diuretics and afterload reduction to hopefully improve this.  May be candidate for MV clipping down the road.  3. CKD: Creatinine 1.9 today.  This is within her normal range.  Not sure of cause for CKD.  4. Smoker: I strongly encouraged her to quit.  5. CVA: Prior CVA.  Continue statin and ASA 81.   Signed: Loralie Champagne 10/01/2014 2:43 PM

## 2014-10-01 NOTE — Progress Notes (Signed)
Echocardiogram 2D Echocardiogram has been performed.  Joelene Millin 10/01/2014, 12:04 PM

## 2014-10-01 NOTE — Progress Notes (Signed)
Initial Nutrition Assessment  DOCUMENTATION CODES:  Severe malnutrition in context of chronic illness  INTERVENTION:  Ensure Enlive (each supplement provides 350kcal and 20 grams of protein), Boost Breeze, Snacks  NUTRITION DIAGNOSIS:  Malnutrition related to cancer and cancer related treatments, nausea as evidenced by NPO status, severe depletion of body fat, severe depletion of muscle mass, percent weight loss.   GOAL:  Patient will meet greater than or equal to 90% of their needs  MONITOR:  PO intake, Supplement acceptance, Labs, Weight trends, I & O's  REASON FOR ASSESSMENT:  Malnutrition Screening Tool    ASSESSMENT: 54 y.o. female, with a pmh of CVA, ovarian cancer, rheumatoid arthritis, and bilateral hip replacements. She presents the emergency department today with progressively worsening shortness of breath over the past 3-4 days.  Pt reports that she used to weigh 174 lbs about one year ago. Due to chemotherapy and constant issues with nausea, she has been eating about 50% less than usual at meals and only 1 to 2 meals per day; She drinks one Boost supplement some days. Weight history shows pt has lost 37 lbs in the past 6-7 months; 17% of body weight.  RD emphasized the importance of adequate nutrition and weight maintenance, especially during chemotherapy. RD provided and discussed "High Calorie High Protein Nutrition Therapy" and "Tips for Increasing Calories and Protein" handouts from the Academy of [employer] and Dietetics. Pt agreeable to receiving nutritional supplements and snacks during admission.   Labs: low GFR, low calcium, low hemoglobin  Height:  Ht Readings from Last 1 Encounters:  09/30/14 5\' 9"  (1.753 m)    Weight:  Wt Readings from Last 1 Encounters:  10/01/14 134 lb 14.4 oz (61.19 kg)    Ideal Body Weight:  66 kg  Wt Readings from Last 10 Encounters:  10/01/14 134 lb 14.4 oz (61.19 kg)  03/25/14 161 lb 9.6 oz (73.301 kg)  08/26/13 173  lb (78.472 kg)  02/25/13 177 lb (80.287 kg)  06/04/12 170 lb (77.111 kg)  02/05/12 166 lb (75.297 kg)    BMI:  Body mass index is 19.91 kg/(m^2).  Estimated Nutritional Needs:  Kcal:  1800-2000  Protein:  80-90 grams  Fluid:  1.8-2 L/day  Skin:   intact; +1 RLE and LLE edema  Diet Order:  Diet Heart Room service appropriate?: Yes; Fluid consistency:: Thin  EDUCATION NEEDS:  Education needs addressed   Intake/Output Summary (Last 24 hours) at 10/01/14 1537 Last data filed at 10/01/14 0900  Gross per 24 hour  Intake    540 ml  Output   1275 ml  Net   -735 ml    Last BM:  5/1  Pryor Ochoa RD, LDN Inpatient Clinical Dietitian Pager: [phone] After Hours Pager: [phone]

## 2014-10-01 NOTE — Care Management Note (Signed)
Case Management Note  Patient Details  Name: Aritzel Krusemark MRN: [id number] Date of Birth: Dec 21, 1960  Subjective/Objective:      Pt admitted on 09/30/14 with CHF.  Pt with ovarian cancer with mets, undergoing chemotherapy.   PTA, pt resides at home with spouse.          Action/Plan: Will follow for discharge planning as patient progresses.  Pt has no PCP.  Will follow up with pt on PCP options.    Expected Discharge Date:  10/04/14               Expected Discharge Plan:  Home/Self Care  In-House Referral:     Discharge planning Services  CM Consult  Post Acute Care Choice:    Choice offered to:     DME Arranged:    DME Agency:     HH Arranged:    HH Agency:     Status of Service:  In process, will continue to follow  Medicare Important Message Given:    Date Medicare IM Given:    Medicare IM give by:    Date Additional Medicare IM Given:    Additional Medicare Important Message give by:     If discussed at [place] of Stay Meetings, dates discussed:    Additional Comments:  Ella Bodo, RN 10/01/2014, 3:14 PM Phone [phone]

## 2014-10-02 DIAGNOSIS — D631 Anemia in chronic kidney disease: Secondary | ICD-10-CM

## 2014-10-02 DIAGNOSIS — N189 Chronic kidney disease, unspecified: Secondary | ICD-10-CM

## 2014-10-02 DIAGNOSIS — I5043 Acute on chronic combined systolic (congestive) and diastolic (congestive) heart failure: Secondary | ICD-10-CM

## 2014-10-02 DIAGNOSIS — N183 Chronic kidney disease, stage 3 unspecified: Secondary | ICD-10-CM | POA: Insufficient documentation

## 2014-10-02 LAB — COMPREHENSIVE METABOLIC PANEL
ALBUMIN: 3 g/dL — AB (ref 3.5–5.0)
ALK PHOS: 119 U/L (ref 38–126)
ALT: 48 U/L (ref 14–54)
AST: 22 U/L (ref 15–41)
Anion gap: 10 (ref 5–15)
BILIRUBIN TOTAL: 0.2 mg/dL — AB (ref 0.3–1.2)
BUN: 61 mg/dL — ABNORMAL HIGH (ref 6–20)
CO2: 20 mmol/L — ABNORMAL LOW (ref 22–32)
Calcium: 8.5 mg/dL — ABNORMAL LOW (ref 8.9–10.3)
Chloride: 109 mmol/L (ref 101–111)
Creatinine, Ser: 2.04 mg/dL — ABNORMAL HIGH (ref 0.44–1.00)
GFR calc Af Amer: 30 mL/min — ABNORMAL LOW (ref >60)
GFR calc non Af Amer: 26 mL/min — ABNORMAL LOW (ref >60)
Glucose, Bld: 99 mg/dL (ref 70–99)
Potassium: 4.3 mmol/L (ref 3.5–5.1)
SODIUM: 139 mmol/L (ref 135–145)
TOTAL PROTEIN: 5.5 g/dL — AB (ref 6.5–8.1)

## 2014-10-02 LAB — TROPONIN I: TROPONIN I: 0.03 ng/mL (ref <0.031)

## 2014-10-02 MED ORDER — FUROSEMIDE 40 MG PO TABS
40.0000 mg | ORAL_TABLET | Freq: Every day | ORAL | Status: DC
  Administered 2014-10-03: 40 mg via ORAL
  Filled 2014-10-02: qty 1

## 2014-10-02 MED ORDER — MAGNESIUM HYDROXIDE 400 MG/5ML PO SUSP
30.0000 mL | Freq: Every day | ORAL | Status: DC | PRN
  Administered 2014-10-02: 30 mL via ORAL
  Filled 2014-10-02: qty 30

## 2014-10-02 MED ORDER — SODIUM CHLORIDE 0.9 % IJ SOLN: 10.0000 mL | Freq: Two times a day (BID) | INTRAMUSCULAR | Status: DC

## 2014-10-02 MED ORDER — SODIUM CHLORIDE 0.9 % IJ SOLN
10.0000 mL | INTRAMUSCULAR | Status: DC | PRN
  Administered 2014-10-02: 30 mL
  Administered 2014-10-03 (×2): 10 mL
  Filled 2014-10-02 (×3): qty 40

## 2014-10-02 MED ORDER — BISOPROLOL FUMARATE 5 MG PO TABS
2.5000 mg | ORAL_TABLET | Freq: Every day | ORAL | Status: DC
  Administered 2014-10-02 – 2014-10-03 (×2): 2.5 mg via ORAL
  Filled 2014-10-02 (×2): qty 0.5

## 2014-10-02 NOTE — Progress Notes (Signed)
Spoke with pt regarding PCP follow up.  Pt states she would like to follow up with Dr. Mertha Finders for her PCP.  Left message for scheduler at Dr. Inda Merlin' office with pt info and phone numbers, and this case manager's contact info.    Lionel December, RN, BSN Phone [phone]

## 2014-10-02 NOTE — Clinical Documentation Improvement (Signed)
MD's, NP's, and PA's   Documentation of "CKD"  if appropriate for this admission please provide the stage .  Thank You   Possible Clinical Conditions?   CKD Stage I - GFR > OR = 90 CKD Stage II - GFR 60-80 CKD Stage III - GFR 30-59 CKD Stage IV - GFR 15-29 CKD Stage V - GFR < 15  Other condition Cannot Clinically determine     Risk Factors: Acute combined syst/diast chf, h/o CVA  Diagnostics: GFR 26/ 28/ 30  Treatment:BMET  Thank You, Ree Kida ,RN Clinical Documentation Specialist:  [phone]  [place] Information Management

## 2014-10-02 NOTE — Progress Notes (Addendum)
PROGRESS NOTE    Anne Stevens ZYS:[id number] DOB: 1960-07-31 DOA: 09/30/2014 PCP: No PCP Per Patient  Primary Oncologist: Dr. 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 Leonie Man, neurology), HLD, ongoing tobacco abuse, depression, recurrent ovarian cancer on treatment with Adriamycin from 2013-2015-stopped chemotherapy in December 2015 and resumed again on 09/29/14 and anemia presented to [facility] ED on 09/30/14 with complaints of progressively worsening shortness of breath, orthopnea, LE edema and dry cough over the past 3-4 days PTA. She has no cardiac history. During chemotherapy 5/3, she had a blood transfusion (2 units) that brought her hemoglobin from 7.5-9.0. In the ED, BNP 1903, chest x-ray showed small right pleural effusion, creatinine1.83 (has been in the 1.8-2.2 range). Patient admitted for acute heart failure.   Assessment/Plan:  Acute combined systolic and diastolic CHF. - Patient denies prior cardiac or CHF history. - New diagnosis. 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 - Started on Lasix 40 mg IV every 12 hours on admission. Transitioned to oral Lasix 5/6 - - 1773 ML since admission. - Low-dose metoprolol XL initiated on admission. Changed to bisoprolol due to COPD - Not started on ACEI/ARB secondary to renal failure. - 2-D echo 5/5: LVEF 25-30 percent, severely reduced LV systolic function with diffuse hypokinesis and grade 2 diastolic dysfunction. Minimally elevated troponin of 0.04 and subsequent once negative. 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 Stress test - Cardiology follow-up appreciated. As per cardiology, cannot rule out ischemic disease given her risk factors (CVA, HLD and smoking) however would not do  cath at this time given CKD - Improving  Stage III chronic kidney disease - Abd ultrasound: No hydronephrosis. - Had not looked at labs in care everywhere yesterday. - Baseline creatinine: 1.8-2.2. - Stable.  Chronic anemia - Hemoglobin was 7.4 on 4/28. Has been ranging in the 7 range lately. - Status post 2 units of PRBC transfusion across chemotherapy on 5/3. - Hemoglobin on admission: 9. - Chronic anemia probably related to underlying malignancy, chronic kidney disease and chronic disease. Acute drop may have been from chemotherapy. - Follow CBCs in a.m.  Lower extremity edema left greater than right - checked lower extremity Dopplers to rule out DVT given patient's cancer history: negative  History of CVA-remote left MCA branch infarct  - Continue 325 mg aspirin & statin - Patient has residual right arm numbness and mild aphasia. - Follows with Dr. Leonie Man at [facility] neurology.  Hyperlipidemia - LFTs unremarkable. Statins resumed.  Recurrent Ovarian Cancer - Now with mets to liver and colon (per patient's report). - Chemo was stopped in 04/2014, but now with new growth of tumor chemo- topotecan was started again 5/3.  Tobacco abuse. - 40 pack year history. Quit Monday PTA. - Nicotine patch ordered.  Depression - Stable without suicidal or homicidal ideations  Severe mitral regurgitation by echo - Probably functional from annular dilatation.   Code Status: Full Family Communication: None at bedside Disposition Plan: DC home possibly 5/7   Consultants:  Cardiology  Procedures:  2-D echo 10/01/14: Study Conclusions  - Left ventricle: The cavity size was moderately dilated. Wall thickness was normal. Systolic function was severely reduced. The estimated ejection fraction was in the range of 25% to 30%. Diffuse hypokinesis. Features are consistent with a pseudonormal left ventricular filling pattern, with  concomitant abnormal relaxation and  increased filling pressure (grade 2 diastolic dysfunction). E/medial e&' > 15 suggests LV end diastolic pressure at least 20 mmHg. - Aortic valve: There was no stenosis. There was trivial regurgitation. - Mitral valve: There was severe central mitral regurgitation that appeared to be due to annular dilatation. - Left atrium: The atrium was moderately dilated. - Right ventricle: The cavity size was normal. Systolic function was mildly reduced. - Pulmonary arteries: PA peak pressure: 42 mm Hg (S). - Systemic veins: IVC measured 2.3 cm with < 50% respirophasic variation, suggesting RA pressure 15 mmHg.  Impressions:  - Moderately dilated LV with EF 25-30%, diffuse hypokinesis. Moderate diastolic dysfunction with evidence for elevated LV filling pressure. Normal RV size with mildly reduced systolic function. There was mild pulmonary hypertension. Dilated IVC suggesting elevated RV filling pressure. There was severe central MR that appeared most likely due to annular dilatation.   Antibiotics:  None   Subjective: Feels much better. Dyspnea almost resolved. Able to come off oxygen this morning.  Objective: Filed Vitals:   10/01/14 2059 10/02/14 0543 10/02/14 0812 10/02/14 1350  BP: 112/80 110/78  117/84  Pulse: 95 87  93  Temp: 98.4 F (36.9 C) 98.2 F (36.8 C)  98.2 F (36.8 C)  TempSrc: Oral Oral  Oral  Resp: 18 17  18   Height:      Weight:      SpO2: 100% 98% 97% 99%    Intake/Output Summary (Last 24 hours) at 10/02/14 1552 Last data filed at 10/02/14 1350  Gross per 24 hour  Intake   1062 ml  Output   2100 ml  Net  -1038 ml   Filed Weights   09/30/14 1700 10/01/14 0524  Weight: 66.86 kg (147 lb 6.4 oz) 61.19 kg (134 lb 14.4 oz)     Exam:  General exam: Moderately built and nourished middle-aged female lying comfortably propped up in bed. Respiratory system: Improved breath sounds compared to yesterday. Occasional basal crackles but  otherwise clear to auscultation. No increased work of breathing. Cardiovascular system: S1 & S2 heard, RRR. No JVD, murmurs, gallops, clicks. Trace bilateral leg edema. Telemetry: Sinus rhythm. Gastrointestinal system: Abdomen is nondistended, soft and nontender. Normal bowel sounds heard. Central nervous system: Alert and oriented. No focal neurological deficits. Extremities: Symmetric 5 x 5 power.   Data Reviewed: Basic Metabolic Panel:  Recent Labs Lab 09/30/14 1232 10/01/14 0353 10/02/14 0500  NA 138 139 139  K 5.0 4.8 4.3  CL 113* 112* 109  CO2 17* 19* 20*  GLUCOSE 86 104* 99  BUN 45* 55* 61*  CREATININE 1.83* 1.94* 2.04*  CALCIUM 8.5* 8.3* 8.5*   Liver Function Tests:  Recent Labs Lab 09/30/14 1232 10/02/14 0500  AST 46* 22  ALT 78* 48  ALKPHOS 146* 119  BILITOT 0.8 0.2*  PROT 5.9* 5.5*  ALBUMIN 3.2* 3.0*   No results for input(s): LIPASE, AMYLASE in the last 168 hours. No results for input(s): AMMONIA in the last 168 hours. CBC:  Recent Labs Lab 09/30/14 1232  WBC 3.9*  NEUTROABS 2.9  HGB 9.0*  HCT 28.1*  MCV 99.3  PLT 169   Cardiac Enzymes:  Recent Labs Lab 09/30/14 1852 10/01/14 1529 10/01/14 2030 10/02/14 0230  TROPONINI 0.04* 0.04* 0.03 0.03   BNP (last 3 results) No results for input(s): PROBNP in the last 8760 hours. CBG: No results for input(s): GLUCAP in the last 168 hours.  Recent Results (from the past 240 hour(s))  MRSA PCR Screening     Status: Abnormal   Collection Time: 09/30/14  7:00 PM  Result Value Ref Range Status   MRSA by PCR POSITIVE (A) NEGATIVE Final    Comment:        The GeneXpert MRSA Assay (FDA approved for NASAL specimens only), is one component of a comprehensive MRSA colonization surveillance program. It is not intended to diagnose MRSA infection nor to guide or monitor treatment for MRSA infections. RESULT CALLED TO, READ BACK BY AND VERIFIED WITH: Royetta Asal RN 2059 09/30/14 A BROWNING          Studies: US Abdomen Complete  10/01/2014   CLINICAL DATA:  Renal failure. History of metastatic ovarian cancer.  EXAM: ULTRASOUND ABDOMEN COMPLETE  COMPARISON:  None.  FINDINGS: Gallbladder: Cholelithiasis without pericholecystic fluid. Top normal gallbladder wall thickness which appears uniform. Negative sonographic Murphy sign.  Common bile duct: Diameter: 3.4 mm  Liver: No focal lesion identified. Within normal limits in parenchymal echogenicity. Trace amount of perihepatic fluid.  IVC: No abnormality visualized.  Pancreas: Limited visualization secondary to overlying bowel gas. Visualized portion unremarkable.  Spleen: Size and appearance within normal limits.  Right Kidney: Length: 10.4 cm. Increased renal cortical echogenicity. No mass or hydronephrosis visualized.  Left Kidney: Length: 10.8 cm. Increased renal cortical echogenicity. Echogenicity within normal limits. No mass or hydronephrosis visualized.  Abdominal aorta: Abdominal aorta measures 2.9 cm in AP diameter. There is atherosclerotic disease of the abdominal aorta.  Other findings: None.  IMPRESSION: 1. Cholelithiasis without sonographic evidence of acute cholecystitis. 2. Trace perihepatic free fluid. 3. Mild increased renal cortical echogenicity as can be seen with medical renal disease or acute kidney injury. 4. 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 Recommend followup by ultrasound in 5 years. This recommendation follows ACR consensus guidelines: White Paper of the ACR Incidental Findings Committee II on Vascular Findings. J Am Coll Radiol 2013; 10:789-794.   Electronically Signed   By: Kathreen Devoid   On: 10/01/2014 19:06        Scheduled Meds: . ARIPiprazole  2 mg Oral QAC breakfast  . aspirin  325 mg Oral Daily  . atorvastatin  20 mg Oral q1800  . bisoprolol  2.5 mg Oral Daily  . Chlorhexidine Gluconate Cloth  6 each Topical Q0600  . feeding supplement (ENSURE ENLIVE)  237 mL Oral BID BM  . feeding  supplement (RESOURCE BREEZE)  1 Container Oral Q24H  . [START ON 10/03/2014] furosemide  40 mg Oral Daily  . heparin  5,000 Units Subcutaneous 3 times per day  . hydrALAZINE  25 mg Oral 3 times per day  . isosorbide mononitrate  30 mg Oral Daily  . mometasone-formoterol  2 puff Inhalation BID  . mupirocin ointment  1 application Nasal BID  . nicotine  21 mg Transdermal Daily  . sodium chloride  10-40 mL Intracatheter Q12H  . sodium chloride  3 mL Intravenous Q12H  . topiramate  50 mg Oral BID  . venlafaxine XR  300 mg Oral QAC breakfast   Continuous Infusions:   Principal Problem:   CHF (congestive heart failure) Active Problems:   Acute blood loss anemia   H/O stroke associated with blood clotting tendency   Right hemiparesis   Aphasia   Acute renal failure   Acute heart failure   Dyspnea   Bilateral edema of lower extremity   Ovarian cancer   Protein-calorie malnutrition, severe    Time spent: 25 minutes.  Vernell Leep, MD, FACP, FHM. [facility] Pager [phone]  If 7PM-7AM, please contact night-coverage [email or web address] Password [email or web address] 10/02/2014, 3:52 PM    LOS: 2 days

## 2014-10-02 NOTE — Progress Notes (Addendum)
Patient ID: Anne Stevens, female   DOB: May 13, 1961, 54 y.o.   MRN: [id number]   SUBJECTIVE: Patient is breathing better this morning. Weight is down, not sure how accurate weight readings are.  Scheduled Meds: . ARIPiprazole  2 mg Oral QAC breakfast  . aspirin  325 mg Oral Daily  . atorvastatin  20 mg Oral q1800  . bisoprolol  2.5 mg Oral Daily  . Chlorhexidine Gluconate Cloth  6 each Topical Q0600  . feeding supplement (ENSURE ENLIVE)  237 mL Oral BID BM  . feeding supplement (RESOURCE BREEZE)  1 Container Oral Q24H  . [START ON 10/03/2014] furosemide  40 mg Oral Daily  . heparin  5,000 Units Subcutaneous 3 times per day  . hydrALAZINE  25 mg Oral 3 times per day  . isosorbide mononitrate  30 mg Oral Daily  . mometasone-formoterol  2 puff Inhalation BID  . mupirocin ointment  1 application Nasal BID  . nicotine  21 mg Transdermal Daily  . sodium chloride  10-40 mL Intracatheter Q12H  . sodium chloride  3 mL Intravenous Q12H  . topiramate  50 mg Oral BID  . venlafaxine XR  300 mg Oral QAC breakfast   Continuous Infusions:  PRN Meds:.sodium chloride, acetaminophen, albuterol, clonazePAM, ondansetron (ZOFRAN) IV, oxyCODONE-acetaminophen **AND** oxyCODONE, sodium chloride, sodium chloride    Filed Vitals:   10/01/14 1334 10/01/14 2012 10/01/14 2059 10/02/14 0543  BP: 126/87  112/80 110/78  Pulse: 91 94 95 87  Temp: 98.2 F (36.8 C)  98.4 F (36.9 C) 98.2 F (36.8 C)  TempSrc: Oral  Oral Oral  Resp: 20 18 18 17   Height:      Weight:      SpO2: 100% 98% 100% 98%    Intake/Output Summary (Last 24 hours) at 10/02/14 0750 Last data filed at 10/02/14 0600  Gross per 24 hour  Intake    720 ml  Output   1200 ml  Net   -480 ml    LABS: Basic Metabolic Panel:  Recent Labs  10/01/14 0353 10/02/14 0500  NA 139 139  K 4.8 4.3  CL 112* 109  CO2 19* 20*  GLUCOSE 104* 99  BUN 55* 61*  CREATININE 1.94* 2.04*  CALCIUM 8.3* 8.5*   Liver Function Tests:  Recent Labs  09/30/14 1232 10/02/14 0500  AST 46* 22  ALT 78* 48  ALKPHOS 146* 119  BILITOT 0.8 0.2*  PROT 5.9* 5.5*  ALBUMIN 3.2* 3.0*   No results for input(s): LIPASE, AMYLASE in the last 72 hours. CBC:  Recent Labs  09/30/14 1232  WBC 3.9*  NEUTROABS 2.9  HGB 9.0*  HCT 28.1*  MCV 99.3  PLT 169   Cardiac Enzymes:  Recent Labs  10/01/14 1529 10/01/14 2030 10/02/14 0230  TROPONINI 0.04* 0.03 0.03   BNP: Invalid input(s): POCBNP D-Dimer: No results for input(s): DDIMER in the last 72 hours. Hemoglobin A1C: No results for input(s): HGBA1C in the last 72 hours. Fasting Lipid Panel: No results for input(s): CHOL, HDL, LDLCALC, TRIG, CHOLHDL, LDLDIRECT in the last 72 hours. Thyroid Function Tests:  Recent Labs  10/01/14 1529  TSH 1.771   Anemia Panel: No results for input(s): VITAMINB12, FOLATE, FERRITIN, TIBC, IRON, RETICCTPCT in the last 72 hours.  RADIOLOGY: Dg Chest 2 View  09/30/2014   CLINICAL DATA:  Shortness of Breath  EXAM: CHEST  2 VIEW  COMPARISON:  01/18/2011  FINDINGS: Borderline cardiomegaly. No pulmonary edema. Right IJ Port-A-Cath with tip in SVC right atrium  junction. There is small right pleural effusion with right basilar atelectasis or infiltrate. Mild left basilar atelectasis.  IMPRESSION: Small right pleural effusion or right basilar atelectasis or infiltrate. Small left basilar atelectasis. No pulmonary edema.   Electronically Signed   By: Lahoma Crocker M.D.   On: 09/30/2014 13:36   US Abdomen Complete  10/01/2014   CLINICAL DATA:  Renal failure. History of metastatic ovarian cancer.  EXAM: ULTRASOUND ABDOMEN COMPLETE  COMPARISON:  None.  FINDINGS: Gallbladder: Cholelithiasis without pericholecystic fluid. Top normal gallbladder wall thickness which appears uniform. Negative sonographic Murphy sign.  Common bile duct: Diameter: 3.4 mm  Liver: No focal lesion identified. Within normal limits in parenchymal echogenicity. Trace amount of perihepatic fluid.  IVC:  No abnormality visualized.  Pancreas: Limited visualization secondary to overlying bowel gas. Visualized portion unremarkable.  Spleen: Size and appearance within normal limits.  Right Kidney: Length: 10.4 cm. Increased renal cortical echogenicity. No mass or hydronephrosis visualized.  Left Kidney: Length: 10.8 cm. Increased renal cortical echogenicity. Echogenicity within normal limits. No mass or hydronephrosis visualized.  Abdominal aorta: Abdominal aorta measures 2.9 cm in AP diameter. There is atherosclerotic disease of the abdominal aorta.  Other findings: None.  IMPRESSION: 1. Cholelithiasis without sonographic evidence of acute cholecystitis. 2. Trace perihepatic free fluid. 3. Mild increased renal cortical echogenicity as can be seen with medical renal disease or acute kidney injury. 4. 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 Recommend followup by ultrasound in 5 years. This recommendation follows ACR consensus guidelines: White Paper of the ACR Incidental Findings Committee II on Vascular Findings. J Am Coll Radiol 2013; 10:789-794.   Electronically Signed   By: Kathreen Devoid   On: 10/01/2014 19:06    PHYSICAL EXAM General: NAD Neck: No JVD, no thyromegaly or thyroid nodule.  Lungs: Clear to auscultation bilaterally with normal respiratory effort. CV: Nondisplaced PMI.  Heart regular S1/S2, no S3/S4, 2/6 HSM apex.  No peripheral edema.  No carotid bruit.  Normal pedal pulses.  Abdomen: Soft, nontender, no hepatosplenomegaly, no distention.  Neurologic: Alert and oriented x 3.  Psych: Normal affect. Extremities: No clubbing or cyanosis.   TELEMETRY: Reviewed telemetry pt in NSR   ASSESSMENT AND PLAN:  54 yo with history of ovarian cancer with recurrence, CVA 12/11, COPD/smoking, CKD presented with dyspnea and was found to have systolic CHF exacerbation. 1. Acute systolic CHF: I reviewed echo today. EF 25-30%, moderately dilated LV, severe central MR likely functional from  annular dilatation. She has had symptoms for about 3 weeks or so. No particular trigger. No chest pain. No known prior cardiac disease. She is volume overloaded on exam but not markedl. She has started feeling better with diuresis. 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 Echo prior to doxorubicin initiation in 4/15 showed normal EF. Cannot rule out ischemic disease given her risk factors (known vascular disease with prior CVA, hyperlipidemia, active smoking). However, would not do cardiac cath at this time given CKD.  On exam today, she looks euvolemic.  - Change Lasix to 40 mg po daily.  As she got an IV dose this morning, no more Lasix until tomorrow.   - No ACEI with elevated creatinine. I started afterload reduction with hydralazine 25 mg tid + imdur 30.  - Given COPD, I may eventually transition metoprolol to beta-1 selective bisoprolol at 2.5 mg daily.  2. Mitral regurgitation: Severe central MR. Probably functional from annular dilatation. Treat CHF with diuretics and afterload reduction to hopefully  improve this. May be candidate for MV clipping down the road.  3. CKD: Creatinine 2 today. This is not far from her normal range over the last few months. Not sure of cause for CKD.  4. Smoker: I strongly encouraged her to quit.  5. CVA: Prior CVA. Continue statin and ASA 81.  6. Disposition: Ambulate today, try her off oxygen. ?Home tomorrow.   Loralie Champagne 10/02/2014 7:55 AM

## 2014-10-02 NOTE — Progress Notes (Signed)
CARDIAC REHAB PHASE I   PRE:  Rate/Rhythm: 94 SR  BP:  Sitting: 118/64        SaO2: 100 RA  MODE:  Ambulation: 260 ft   POST:  Rate/Rhythm: 97 SR  BP:  Sitting:130/90         SaO2: 89 RA  Pt ambulated 260  ft on RA , independent, mildly unsteady gait at baseline (pt states she has had hip replacement) , tolerated fair.  Pt c/o of dyspnea at rest and DOE, denies cp, dizziness, brief standing rest x 2. Completed CHF education.  Reviewed exercise gld, heart healthy diet, sodium and fluid restrictions, daily weights, phase 2 cardiac rehab. Gave CHF booklet. Pt verbalized understanding. Pt agrees to phase 2 cardiac rehab. Will send referral to [facility].    [id number]    Lenna Sciara, RN, BSN 10/02/2014 12:31 PM

## 2014-10-02 NOTE — Care Management (Signed)
Anderson Malta called back from Dr. Inda Merlin office:  Dr. Inda Merlin is taking new patients, but first available appointment is in August.  Appointment made for August 22, and information put on AVS.  If this appointment is too far away and needs to be rescheduled, please call office to cancel.    Lionel December, RN, BSN Phone [phone]

## 2014-10-03 ENCOUNTER — Other Ambulatory Visit: Payer: Self-pay | Admitting: Physician Assistant

## 2014-10-03 DIAGNOSIS — N179 Acute kidney failure, unspecified: Secondary | ICD-10-CM

## 2014-10-03 DIAGNOSIS — I5043 Acute on chronic combined systolic (congestive) and diastolic (congestive) heart failure: Secondary | ICD-10-CM

## 2014-10-03 LAB — BASIC METABOLIC PANEL
Anion gap: 9 (ref 5–15)
BUN: 67 mg/dL — ABNORMAL HIGH (ref 6–20)
CO2: 20 mmol/L — AB (ref 22–32)
Calcium: 8.4 mg/dL — ABNORMAL LOW (ref 8.9–10.3)
Chloride: 110 mmol/L (ref 101–111)
Creatinine, Ser: 1.99 mg/dL — ABNORMAL HIGH (ref 0.44–1.00)
GFR calc Af Amer: 31 mL/min — ABNORMAL LOW (ref >60)
GFR calc non Af Amer: 27 mL/min — ABNORMAL LOW (ref >60)
Glucose, Bld: 83 mg/dL (ref 70–99)
Potassium: 4.5 mmol/L (ref 3.5–5.1)
SODIUM: 139 mmol/L (ref 135–145)

## 2014-10-03 LAB — CBC
HEMATOCRIT: 26.3 % — AB (ref 36.0–46.0)
HEMOGLOBIN: 8.8 g/dL — AB (ref 12.0–15.0)
MCH: 32.8 pg (ref 26.0–34.0)
MCHC: 33.5 g/dL (ref 30.0–36.0)
MCV: 98.1 fL (ref 78.0–100.0)
Platelets: 196 10*3/uL (ref 150–400)
RBC: 2.68 MIL/uL — AB (ref 3.87–5.11)
RDW: 16 % — ABNORMAL HIGH (ref 11.5–15.5)
WBC: 3.8 10*3/uL — AB (ref 4.0–10.5)

## 2014-10-03 LAB — BRAIN NATRIURETIC PEPTIDE: B Natriuretic Peptide: 1148.1 pg/mL — ABNORMAL HIGH (ref 0.0–100.0)

## 2014-10-03 MED ORDER — NICOTINE 21 MG/24HR TD PT24: 21.0000 mg | MEDICATED_PATCH | Freq: Every day | TRANSDERMAL | Status: DC

## 2014-10-03 MED ORDER — ENSURE ENLIVE PO LIQD: 237.0000 mL | Freq: Two times a day (BID) | ORAL | Status: DC

## 2014-10-03 MED ORDER — ISOSORBIDE MONONITRATE ER 30 MG PO TB24: 30.0000 mg | ORAL_TABLET | Freq: Every day | ORAL | Status: DC

## 2014-10-03 MED ORDER — HYDRALAZINE HCL 25 MG PO TABS: 25.0000 mg | ORAL_TABLET | Freq: Three times a day (TID) | ORAL | Status: DC

## 2014-10-03 MED ORDER — HEPARIN SOD (PORK) LOCK FLUSH 100 UNIT/ML IV SOLN
500.0000 [IU] | INTRAVENOUS | Status: AC | PRN
  Administered 2014-10-03: 500 [IU]

## 2014-10-03 MED ORDER — BISOPROLOL FUMARATE 5 MG PO TABS: 2.5000 mg | ORAL_TABLET | Freq: Every day | ORAL | Status: DC

## 2014-10-03 MED ORDER — FUROSEMIDE 40 MG PO TABS: 40.0000 mg | ORAL_TABLET | Freq: Every day | ORAL | Status: DC

## 2014-10-03 MED ORDER — BOOST / RESOURCE BREEZE PO LIQD: 1.0000 | ORAL | Status: AC

## 2014-10-03 NOTE — Discharge Summary (Addendum)
Physician Discharge Summary  Anne Stevens YQM:[id number] DOB: [birth year] DOA: 09/30/2014  PCP: No PCP Per Patient  Admit date: 09/30/2014 Discharge date: 10/03/2014  Time spent: Greater than 30 minutes  Recommendations for Outpatient Follow-up:  1. Dr. Loralie Champagne, Cardiology in 1 week. MD's office will arrange labs (CBC & BMP) via their lab services on Mon 10/05/2014. 2. Dr. Josetta Huddle, new PCP on 01/18/15 at 3 PM 3. Dr. Fay Records, GYN oncology: Patient advised to keep her previous appointment on 10/08/14  Discharge Diagnoses:  Principal Problem:   CHF (congestive heart failure) Active Problems:   Acute blood loss anemia   H/O stroke associated with blood clotting tendency   Right hemiparesis   Aphasia   Acute renal failure   Acute heart failure   Dyspnea   Bilateral edema of lower extremity   Ovarian cancer   Protein-calorie malnutrition, severe   Systolic and diastolic CHF, acute on chronic   Anemia in chronic kidney disease   Stage III chronic kidney disease   Discharge Condition: Improved & Stable  Diet recommendation: Heart healthy diet  Filed Weights   09/30/14 1700 10/01/14 0524 10/03/14 0653  Weight: 66.86 kg (147 lb 6.4 oz) 61.19 kg (134 lb 14.4 oz) 57.38 kg (126 lb 8 oz)    History of present illness:  54 year old female with PMH of 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 Leonie Man, neurology), HLD, ongoing tobacco abuse, depression, recurrent ovarian cancer on treatment with Adriamycin from 2013-2015-stopped chemotherapy in December 2015 and resumed again on 09/29/14 and anemia presented to [facility] ED on 09/30/14 with complaints of progressively worsening shortness of breath, orthopnea, LE edema and dry cough over the past 3-4 days PTA. She has no cardiac history. During chemotherapy 5/3, she had a blood transfusion (2 units) that brought her  hemoglobin from 7.5-9.0. In the ED, BNP 1903, chest x-ray showed small right pleural effusion, creatinine1.83 (has been in the 1.8-2.2 range). Patient admitted for acute heart failure.  Hospital Course:   Acute combined systolic and diastolic CHF. - Patient denies prior cardiac or CHF history. - New diagnosis. 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 - Started on Lasix 40 mg IV every 12 hours on admission. Transitioned to oral Lasix 5/6 - Low-dose metoprolol XL initiated on admission. Changed to bisoprolol due to COPD - Not started on ACEI/ARB secondary to renal failure. - 2-D echo 5/5: LVEF 25-30 percent, severely reduced LV systolic function with diffuse hypokinesis and grade 2 diastolic dysfunction. Minimally elevated troponin of 0.04 and subsequent once negative. 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 - Cardiology follow-up appreciated. As per cardiology, cannot rule out ischemic disease given her risk factors (CVA, HLD and smoking) however would not do cath at this time given CKD - Improved. Cardiology has seen today-discussed with them and patient cleared for discharge. Discussed with APP regarding arranging for outpatient labs on Monday. - TSH: 1.771 - Ambulated on room air without hypoxia.  Stage III chronic kidney disease - Abd ultrasound: No hydronephrosis. - Had not looked at labs in care everywhere yesterday. - Baseline creatinine: 1.8-2.2. - Stable.  Chronic anemia - Hemoglobin was 7.4 on 4/28. Has been ranging in the 7 range lately. - Status post 2 units of PRBC transfusion across chemotherapy on 5/3. - Hemoglobin on admission: 9. - Chronic anemia probably related to underlying malignancy, chronic  kidney disease and chronic disease. Acute drop may have been from chemotherapy. - Stable  Lower extremity edema left greater than right - checked lower extremity Dopplers to rule out DVT given  patient's cancer history: negative  History of CVA-remote left MCA branch infarct  - Continue 325 mg aspirin & statin - Patient has residual right arm numbness and mild aphasia. - Follows with Dr. Leonie Man at [facility] neurology.  Hyperlipidemia - LFTs unremarkable. Statins resumed.  Recurrent Ovarian Cancer - Now with mets to liver and colon (per patient's report). - Chemo was stopped in 04/2014, but now topotecan was started again 5/3. - Patient states that she has her next appointment with oncology on 5/12   Tobacco abuse. - 40 pack year history. Quit Monday PTA. - Nicotine patch ordered.  Depression - Stable without suicidal or homicidal ideations - Continue home medications   Severe mitral regurgitation by echo - Probably functional from annular dilatation.  Ectatic abdominal aorta at risk for aneurysm development - As per radiology recommendations, repeat ultrasound in 5 years.  Severe malnutrition in context of chronic illness - Management as per dietitian consultation.   Consultants:  Cardiology  Procedures:  2-D echo 10/01/14: Study Conclusions  - Left ventricle: The cavity size was moderately dilated. Wall thickness was normal. Systolic function was severely reduced. The estimated ejection fraction was in the range of 25% to 30%. Diffuse hypokinesis. Features are consistent with a pseudonormal left ventricular filling pattern, with concomitant abnormal relaxation and increased filling pressure (grade 2 diastolic dysfunction). E/medial e&' > 15 suggests LV end diastolic pressure at least 20 mmHg. - Aortic valve: There was no stenosis. There was trivial regurgitation. - Mitral valve: There was severe central mitral regurgitation that appeared to be due to annular dilatation. - Left atrium: The atrium was moderately dilated. - Right ventricle: The cavity size was normal. Systolic function was mildly reduced. - Pulmonary arteries: PA peak  pressure: 42 mm Hg (S). - Systemic veins: IVC measured 2.3 cm with < 50% respirophasic variation, suggesting RA pressure 15 mmHg.  Impressions:  - Moderately dilated LV with EF 25-30%, diffuse hypokinesis. Moderate diastolic dysfunction with evidence for elevated LV filling pressure. Normal RV size with mildly reduced systolic function. There was mild pulmonary hypertension. Dilated IVC suggesting elevated RV filling pressure. There was severe central MR that appeared most likely due to annular dilatation.  Bilateral lower extremity venous Dopplers 10/01/14: Summary:  - No evidence of deep vein thrombosis involving the right lower extremity and left lower extremity. - No evidence of Baker&'s cyst on the right or left.   Discharge Exam:  Complaints:  Patient denies complaints. Denies dyspnea or chest pain.  Filed Vitals:   10/03/14 0642 10/03/14 0653 10/03/14 0930 10/03/14 0944  BP: 120/86  115/85   Pulse: 88  95   Temp: 98.5 F (36.9 C)  98.1 F (36.7 C)   TempSrc: Oral  Oral   Resp: 17  20   Height:      Weight:  57.38 kg (126 lb 8 oz)    SpO2: 98%  100% 96%    General exam: Moderately built and nourished middle-aged female lying comfortably propped up in bed. Respiratory system: Improved breath sounds compared to yesterday. Occasional basal crackles but otherwise clear to auscultation. No increased work of breathing. Cardiovascular system: S1 & S2 heard, RRR. No JVD, murmurs, gallops, clicks. Trace bilateral leg edema. Telemetry: Sinus rhythm. Gastrointestinal system: Abdomen is nondistended, soft and nontender. Normal bowel sounds heard. Central  nervous system: Alert and oriented. No focal neurological deficits. Extremities: Symmetric 5 x 5 power  Discharge Instructions      Discharge Instructions    (HEART FAILURE PATIENTS) Call MD:  Anytime you have any of the following symptoms: 1) 3 pound weight gain in 24 hours or 5 pounds in 1 week 2) shortness  of breath, with or without a dry hacking cough 3) swelling in the hands, feet or stomach 4) if you have to sleep on extra pillows at night in order to breathe.    Complete by:  As directed      Amb Referral to Cardiac Rehabilitation    Complete by:  As directed   Congestive Heart Failure: If diagnosis is Heart Failure, patient MUST meet each of the CMS criteria: 1. Left Ventricular Ejection Fraction </= 35% 2. NYHA class II-IV symptoms despite being on optimal heart failure therapy for at least 6 weeks. 3. Stable = have not had a recent (<6 weeks) or planned (<6 months) major cardiovascular hospitalization or procedure  Program Details: - Physician supervised classes - 1-3 classes per week over a 12-18 week period, generally for a total of 36 sessions  Physician Certification: I certify that the above Cardiac Rehabilitation treatment is medically necessary and is medically approved by me for treatment of this patient. The patient is willing and cooperative, able to ambulate and medically stable to participate in exercise rehabilitation. The participant's progress and Individualized Treatment Plan will be reviewed by the Medical Director, Cardiac Rehab staff and as indicated by the Referring/Ordering Physician.  Diagnosis:  Heart Failure (see criteria below)     Call MD for:  difficulty breathing, headache or visual disturbances    Complete by:  As directed      Call MD for:  extreme fatigue    Complete by:  As directed      Call MD for:  persistant dizziness or light-headedness    Complete by:  As directed      Call MD for:  severe uncontrolled pain    Complete by:  As directed      Diet - low sodium heart healthy    Complete by:  As directed      Increase activity slowly    Complete by:  As directed             Medication List    TAKE these medications        ADVAIR DISKUS 500-50 MCG/DOSE Aepb  Generic drug:  Fluticasone-Salmeterol  Inhale 2 puffs into the lungs 2 (two) times  daily as needed.     ARIPiprazole 2 MG tablet  Commonly known as:  ABILIFY  Take 2 mg by mouth daily before breakfast.     aspirin 325 MG tablet  Take 325 mg by mouth daily.     atorvastatin 20 MG tablet  Commonly known as:  LIPITOR  Take 1 tablet (20 mg total) by mouth daily.     bisoprolol 5 MG tablet  Commonly known as:  ZEBETA  Take 0.5 tablets (2.5 mg total) by mouth daily.     clonazePAM 1 MG tablet  Commonly known as:  KLONOPIN  Take 1 mg by mouth 2 (two) times daily as needed. anxiety     feeding supplement (ENSURE ENLIVE) Liqd  Take 237 mLs by mouth 2 (two) times daily between meals.     feeding supplement (RESOURCE BREEZE) Liqd  Take 1 Container by mouth daily.     furosemide 40  MG tablet  Commonly known as:  LASIX  Take 1 tablet (40 mg total) by mouth daily.     hydrALAZINE 25 MG tablet  Commonly known as:  APRESOLINE  Take 1 tablet (25 mg total) by mouth 3 (three) times daily.     isosorbide mononitrate 30 MG 24 hr tablet  Commonly known as:  IMDUR  Take 1 tablet (30 mg total) by mouth daily.     nicotine 21 mg/24hr patch  Commonly known as:  NICODERM CQ - dosed in mg/24 hours  Place 1 patch (21 mg total) onto the skin daily.     oxyCODONE-acetaminophen 10-325 MG per tablet  Commonly known as:  PERCOCET  Take 1 tablet by mouth every 4 (four) hours as needed. Pain     PROAIR HFA 108 (90 BASE) MCG/ACT inhaler  Generic drug:  albuterol  Inhale 2 puffs into the lungs every 4 (four) hours as needed.     topiramate 50 MG tablet  Commonly known as:  TOPAMAX  Take 50 mg by mouth 2 (two) times daily. Takes 50 mg in the morning and 100 mg at night     topotecan in sodium chloride 0.9 % 100 mL  Inject 1.5 mg/m2 into the vein once a week.     venlafaxine XR 150 MG 24 hr capsule  Commonly known as:  EFFEXOR-XR  Take 300 mg by mouth daily before breakfast.       Follow-up Information    Follow up with Henrine Screws, MD On 01/18/2015.   Specialty:   Internal Medicine   Why:  3:00pm   MD's office will mail packet of paperwork to you.     Contact information:   [address] [place] [address] [place] [address] [phone]       Follow up with Loralie Champagne, MD. Schedule an appointment as soon as possible for a visit in 1 week.   Specialty:  Cardiology   Contact information:   [address] [place] [address] [phone]       Follow up with [facility], MD On 10/08/2014.   Specialty:  Obstetrics and Gynecology   Why:  Keep previous follow up appointment.   Contact information:   [place] [address] [phone]        The results of significant diagnostics from this hospitalization (including imaging, microbiology, ancillary and laboratory) are listed below for reference.    Significant Diagnostic Studies: Dg Chest 2 View  09/30/2014   CLINICAL DATA:  Shortness of Breath  EXAM: CHEST  2 VIEW  COMPARISON:  01/18/2011  FINDINGS: Borderline cardiomegaly. No pulmonary edema. Right IJ Port-A-Cath with tip in SVC right atrium junction. There is small right pleural effusion with right basilar atelectasis or infiltrate. Mild left basilar atelectasis.  IMPRESSION: Small right pleural effusion or right basilar atelectasis or infiltrate. Small left basilar atelectasis. No pulmonary edema.   Electronically Signed   By: Lahoma Crocker M.D.   On: 09/30/2014 13:36   US Abdomen Complete  10/01/2014   CLINICAL DATA:  Renal failure. History of metastatic ovarian cancer.  EXAM: ULTRASOUND ABDOMEN COMPLETE  COMPARISON:  None.  FINDINGS: Gallbladder: Cholelithiasis without pericholecystic fluid. Top normal gallbladder wall thickness which appears uniform. Negative sonographic Murphy sign.  Common bile duct: Diameter: 3.4 mm  Liver: No focal lesion identified. Within normal limits in parenchymal echogenicity. Trace amount of perihepatic fluid.  IVC: No abnormality visualized.  Pancreas: Limited  visualization secondary to overlying  bowel gas. Visualized portion unremarkable.  Spleen: Size and appearance within normal limits.  Right Kidney: Length: 10.4 cm. Increased renal cortical echogenicity. No mass or hydronephrosis visualized.  Left Kidney: Length: 10.8 cm. Increased renal cortical echogenicity. Echogenicity within normal limits. No mass or hydronephrosis visualized.  Abdominal aorta: Abdominal aorta measures 2.9 cm in AP diameter. There is atherosclerotic disease of the abdominal aorta.  Other findings: None.  IMPRESSION: 1. Cholelithiasis without sonographic evidence of acute cholecystitis. 2. Trace perihepatic free fluid. 3. Mild increased renal cortical echogenicity as can be seen with medical renal disease or acute kidney injury. 4. 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 Recommend followup by ultrasound in 5 years. This recommendation follows ACR consensus guidelines: White Paper of the ACR Incidental Findings Committee II on Vascular Findings. J Am Coll Radiol 2013; 10:789-794.   Electronically Signed   By: Kathreen Devoid   On: 10/01/2014 19:06    Microbiology: Recent Results (from the past 240 hour(s))  MRSA PCR Screening     Status: Abnormal   Collection Time: 09/30/14  7:00 PM  Result Value Ref Range Status   MRSA by PCR POSITIVE (A) NEGATIVE Final    Comment:        The GeneXpert MRSA Assay (FDA approved for NASAL specimens only), is one component of a comprehensive MRSA colonization surveillance program. It is not intended to diagnose MRSA infection nor to guide or monitor treatment for MRSA infections. RESULT CALLED TO, READ BACK BY AND VERIFIED WITH: E GARNETT RN 2059 09/30/14 A BROWNING      Labs: Basic Metabolic Panel:  Recent Labs Lab 09/30/14 1232 10/01/14 0353 10/02/14 0500 10/03/14 0420  NA 138 139 139 139  K 5.0 4.8 4.3 4.5  CL 113* 112* 109 110  CO2 17* 19* 20* 20*  GLUCOSE 86 104* 99 83  BUN 45* 55* 61* 67*  CREATININE 1.83*  1.94* 2.04* 1.99*  CALCIUM 8.5* 8.3* 8.5* 8.4*   Liver Function Tests:  Recent Labs Lab 09/30/14 1232 10/02/14 0500  AST 46* 22  ALT 78* 48  ALKPHOS 146* 119  BILITOT 0.8 0.2*  PROT 5.9* 5.5*  ALBUMIN 3.2* 3.0*   No results for input(s): LIPASE, AMYLASE in the last 168 hours. No results for input(s): AMMONIA in the last 168 hours. CBC:  Recent Labs Lab 09/30/14 1232 10/03/14 0420  WBC 3.9* 3.8*  NEUTROABS 2.9  --   HGB 9.0* 8.8*  HCT 28.1* 26.3*  MCV 99.3 98.1  PLT 169 196   Cardiac Enzymes:  Recent Labs Lab 09/30/14 1852 10/01/14 1529 10/01/14 2030 10/02/14 0230  TROPONINI 0.04* 0.04* 0.03 0.03   BNP: BNP (last 3 results)  Recent Labs  09/30/14 1232 10/03/14 0420  BNP 1903.7* 1148.1*    ProBNP (last 3 results) No results for input(s): PROBNP in the last 8760 hours.  CBG: No results for input(s): GLUCAP in the last 168 hours.    Signed:  Vernell Leep, MD, FACP, FHM. [facility] Pager [phone]  If 7PM-7AM, please contact night-coverage [email or web address] Password [email or web address] 10/03/2014, 12:00 PM     \

## 2014-10-03 NOTE — Progress Notes (Addendum)
SATURATION QUALIFICATIONS: (This note is used to comply with regulatory documentation for home oxygen)  Patient Saturations on Room Air at Rest = 100%  Patient Saturations on Room Air while Ambulating = 94%  Patient denied any SOB or pain while ambulating.

## 2014-10-03 NOTE — Progress Notes (Signed)
Patient ID: Anne Stevens, female   DOB: 07-29-1960, 54 y.o.   MRN: [id number]   SUBJECTIVE: Denies dyspnea or chest pain  Scheduled Meds: . ARIPiprazole  2 mg Oral QAC breakfast  . aspirin  325 mg Oral Daily  . atorvastatin  20 mg Oral q1800  . bisoprolol  2.5 mg Oral Daily  . Chlorhexidine Gluconate Cloth  6 each Topical Q0600  . feeding supplement (ENSURE ENLIVE)  237 mL Oral BID BM  . feeding supplement (RESOURCE BREEZE)  1 Container Oral Q24H  . furosemide  40 mg Oral Daily  . heparin  5,000 Units Subcutaneous 3 times per day  . hydrALAZINE  25 mg Oral 3 times per day  . isosorbide mononitrate  30 mg Oral Daily  . mometasone-formoterol  2 puff Inhalation BID  . mupirocin ointment  1 application Nasal BID  . nicotine  21 mg Transdermal Daily  . sodium chloride  10-40 mL Intracatheter Q12H  . sodium chloride  3 mL Intravenous Q12H  . topiramate  50 mg Oral BID  . venlafaxine XR  300 mg Oral QAC breakfast   Continuous Infusions:  PRN Meds:.sodium chloride, acetaminophen, albuterol, clonazePAM, magnesium hydroxide, ondansetron (ZOFRAN) IV, oxyCODONE-acetaminophen **AND** oxyCODONE, sodium chloride, sodium chloride    Filed Vitals:   10/02/14 2014 10/02/14 2039 10/03/14 0642 10/03/14 0653  BP:  111/77 120/86   Pulse: 91 90 88   Temp:  98.1 F (36.7 C) 98.5 F (36.9 C)   TempSrc:  Oral Oral   Resp: 18 16 17    Height:      Weight:    126 lb 8 oz (57.38 kg)  SpO2: 95% 98% 98%     Intake/Output Summary (Last 24 hours) at 10/03/14 0941 Last data filed at 10/03/14 0649  Gross per 24 hour  Intake   1264 ml  Output   1250 ml  Net     14 ml    LABS: Basic Metabolic Panel:  Recent Labs  10/02/14 0500 10/03/14 0420  NA 139 139  K 4.3 4.5  CL 109 110  CO2 20* 20*  GLUCOSE 99 83  BUN 61* 67*  CREATININE 2.04* 1.99*  CALCIUM 8.5* 8.4*   Liver Function Tests:  Recent Labs  09/30/14 1232 10/02/14 0500  AST 46* 22  ALT 78* 48  ALKPHOS 146* 119  BILITOT 0.8 0.2*   PROT 5.9* 5.5*  ALBUMIN 3.2* 3.0*   CBC:  Recent Labs  09/30/14 1232 10/03/14 0420  WBC 3.9* 3.8*  NEUTROABS 2.9  --   HGB 9.0* 8.8*  HCT 28.1* 26.3*  MCV 99.3 98.1  PLT 169 196   Cardiac Enzymes:  Recent Labs  10/01/14 1529 10/01/14 2030 10/02/14 0230  TROPONINI 0.04* 0.03 0.03   Thyroid Function Tests:  Recent Labs  10/01/14 1529  TSH 1.771    RADIOLOGY: Dg Chest 2 View  09/30/2014   CLINICAL DATA:  Shortness of Breath  EXAM: CHEST  2 VIEW  COMPARISON:  01/18/2011  FINDINGS: Borderline cardiomegaly. No pulmonary edema. Right IJ Port-A-Cath with tip in SVC right atrium junction. There is small right pleural effusion with right basilar atelectasis or infiltrate. Mild left basilar atelectasis.  IMPRESSION: Small right pleural effusion or right basilar atelectasis or infiltrate. Small left basilar atelectasis. No pulmonary edema.   Electronically Signed   By: Lahoma Crocker M.D.   On: 09/30/2014 13:36   US Abdomen Complete  10/01/2014   CLINICAL DATA:  Renal failure. History of metastatic ovarian cancer.  EXAM: ULTRASOUND ABDOMEN COMPLETE  COMPARISON:  None.  FINDINGS: Gallbladder: Cholelithiasis without pericholecystic fluid. Top normal gallbladder wall thickness which appears uniform. Negative sonographic Murphy sign.  Common bile duct: Diameter: 3.4 mm  Liver: No focal lesion identified. Within normal limits in parenchymal echogenicity. Trace amount of perihepatic fluid.  IVC: No abnormality visualized.  Pancreas: Limited visualization secondary to overlying bowel gas. Visualized portion unremarkable.  Spleen: Size and appearance within normal limits.  Right Kidney: Length: 10.4 cm. Increased renal cortical echogenicity. No mass or hydronephrosis visualized.  Left Kidney: Length: 10.8 cm. Increased renal cortical echogenicity. Echogenicity within normal limits. No mass or hydronephrosis visualized.  Abdominal aorta: Abdominal aorta measures 2.9 cm in AP diameter. There is  atherosclerotic disease of the abdominal aorta.  Other findings: None.  IMPRESSION: 1. Cholelithiasis without sonographic evidence of acute cholecystitis. 2. Trace perihepatic free fluid. 3. Mild increased renal cortical echogenicity as can be seen with medical renal disease or acute kidney injury. 4. 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 Recommend followup by ultrasound in 5 years. This recommendation follows ACR consensus guidelines: White Paper of the ACR Incidental Findings Committee II on Vascular Findings. J Am Coll Radiol 2013; 10:789-794.   Electronically Signed   By: Kathreen Devoid   On: 10/01/2014 19:06    PHYSICAL EXAM General: NAD HEENT: normal Neck: No JVD Lungs: CTA CV: RRR Abdomen: Soft, nontender, no distention.  Neurologic: Alert and oriented x 3.  Extremities: No edema  TELEMETRY: Reviewed telemetry pt in NSR   ASSESSMENT AND PLAN:  54 yo with history of ovarian cancer with recurrence, CVA 12/11, COPD/smoking, CKD presented with dyspnea and was found to have systolic CHF exacerbation. 1. Acute systolic CHF: EF 76-54%, moderately dilated LV, severe central MR likely functional from annular dilatation. 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 Echo prior to doxorubicin initiation in 4/15 showed normal EF. Cannot rule out ischemic disease given her risk factors (known vascular disease with prior CVA, hyperlipidemia, active smoking). However, would not do cardiac cath at this time given CKD. Euvolemic today and denies dyspnea. Patient instructed on low NA diet and fluid restriction. - Continue lasix 40 mg daily.  - No ACEI with elevated creatinine.Continue hydralazine/nitrates - Continue bisoprolol. 2. Mitral regurgitation: Severe central MR. Probably functional from annular dilatation. Treat CHF with diuretics and afterload reduction to hopefully improve this. May be candidate for MV clipping down the road.  3. CKD: Will  need close fu of renal function following DC 4. CVA: Prior CVA. Continue statin and ASA 81.  6. Disposition: DC today on present meds; FU in CHF clinic with Dr Aundra Dubin in one week; check BMET Monday with results to Dr Aundra Dubin.  Kirk Ruths 10/03/2014 9:41 AM

## 2014-10-03 NOTE — Progress Notes (Signed)
Pt is being discharged home. Pt has been provided with discharge instructions. RN answered all questions the patient had. Patient is being transported home by her husband.

## 2014-10-03 NOTE — Discharge Instructions (Signed)

## 2014-10-05 ENCOUNTER — Other Ambulatory Visit (INDEPENDENT_AMBULATORY_CARE_PROVIDER_SITE_OTHER): Payer: Medicare Other | Admitting: *Deleted

## 2014-10-05 DIAGNOSIS — N179 Acute kidney failure, unspecified: Secondary | ICD-10-CM | POA: Diagnosis not present

## 2014-10-05 DIAGNOSIS — I5043 Acute on chronic combined systolic (congestive) and diastolic (congestive) heart failure: Secondary | ICD-10-CM | POA: Diagnosis not present

## 2014-10-05 LAB — BASIC METABOLIC PANEL
BUN: 63 mg/dL — ABNORMAL HIGH (ref 6–23)
CALCIUM: 8.8 mg/dL (ref 8.4–10.5)
CHLORIDE: 110 meq/L (ref 96–112)
CO2: 23 mEq/L (ref 19–32)
CREATININE: 2.05 mg/dL — AB (ref 0.40–1.20)
GFR: 26.64 mL/min — ABNORMAL LOW (ref >60.00)
Glucose, Bld: 95 mg/dL (ref 70–99)
Potassium: 4.5 mEq/L (ref 3.5–5.1)
SODIUM: 138 meq/L (ref 135–145)

## 2014-10-05 NOTE — Addendum Note (Signed)
Addended by: Eulis Foster on: 10/05/2014 12:36 PM   Modules accepted: Orders

## 2014-10-12 ENCOUNTER — Telehealth: Payer: Self-pay | Admitting: *Deleted

## 2014-10-12 ENCOUNTER — Ambulatory Visit (HOSPITAL_COMMUNITY)
Admission: RE | Admit: 2014-10-12 | Discharge: 2014-10-12 | Disposition: A | Discharge status: Home / Self Care | Payer: Medicare Other | Source: Ambulatory Visit | Attending: Cardiology | Admitting: Cardiology

## 2014-10-12 VITALS — BP 92/58 | HR 83 | Wt 137.0 lb

## 2014-10-12 DIAGNOSIS — Z8673 Personal history of transient ischemic attack (TIA), and cerebral infarction without residual deficits: Secondary | ICD-10-CM | POA: Diagnosis not present

## 2014-10-12 DIAGNOSIS — Z79899 Other long term (current) drug therapy: Secondary | ICD-10-CM | POA: Diagnosis not present

## 2014-10-12 DIAGNOSIS — R0600 Dyspnea, unspecified: Secondary | ICD-10-CM

## 2014-10-12 DIAGNOSIS — I5022 Chronic systolic (congestive) heart failure: Secondary | ICD-10-CM | POA: Insufficient documentation

## 2014-10-12 DIAGNOSIS — I429 Cardiomyopathy, unspecified: Secondary | ICD-10-CM | POA: Insufficient documentation

## 2014-10-12 DIAGNOSIS — I340 Nonrheumatic mitral (valve) insufficiency: Secondary | ICD-10-CM | POA: Diagnosis not present

## 2014-10-12 DIAGNOSIS — N183 Chronic kidney disease, stage 3 unspecified: Secondary | ICD-10-CM

## 2014-10-12 DIAGNOSIS — J449 Chronic obstructive pulmonary disease, unspecified: Secondary | ICD-10-CM | POA: Diagnosis not present

## 2014-10-12 DIAGNOSIS — E785 Hyperlipidemia, unspecified: Secondary | ICD-10-CM | POA: Insufficient documentation

## 2014-10-12 DIAGNOSIS — N189 Chronic kidney disease, unspecified: Secondary | ICD-10-CM | POA: Insufficient documentation

## 2014-10-12 DIAGNOSIS — Z7982 Long term (current) use of aspirin: Secondary | ICD-10-CM | POA: Insufficient documentation

## 2014-10-12 DIAGNOSIS — C569 Malignant neoplasm of unspecified ovary: Secondary | ICD-10-CM | POA: Diagnosis not present

## 2014-10-12 DIAGNOSIS — M069 Rheumatoid arthritis, unspecified: Secondary | ICD-10-CM | POA: Diagnosis not present

## 2014-10-12 DIAGNOSIS — I5043 Acute on chronic combined systolic (congestive) and diastolic (congestive) heart failure: Secondary | ICD-10-CM

## 2014-10-12 DIAGNOSIS — F17210 Nicotine dependence, cigarettes, uncomplicated: Secondary | ICD-10-CM | POA: Diagnosis not present

## 2014-10-12 LAB — BRAIN NATRIURETIC PEPTIDE: B Natriuretic Peptide: 753.6 pg/mL — ABNORMAL HIGH (ref 0.0–100.0)

## 2014-10-12 LAB — BASIC METABOLIC PANEL
ANION GAP: 8 (ref 5–15)
BUN: 65 mg/dL — AB (ref 6–20)
CO2: 23 mmol/L (ref 22–32)
Calcium: 9.1 mg/dL (ref 8.9–10.3)
Chloride: 108 mmol/L (ref 101–111)
Creatinine, Ser: 2.44 mg/dL — ABNORMAL HIGH (ref 0.44–1.00)
GFR, EST AFRICAN AMERICAN: 25 mL/min — AB (ref >60)
GFR, EST NON AFRICAN AMERICAN: 21 mL/min — AB (ref >60)
Glucose, Bld: 99 mg/dL (ref 65–99)
POTASSIUM: 5.3 mmol/L — AB (ref 3.5–5.1)
Sodium: 139 mmol/L (ref 135–145)

## 2014-10-12 LAB — LIPID PANEL
CHOLESTEROL: 123 mg/dL (ref 0–200)
HDL: 58 mg/dL (ref >40)
LDL CALC: 55 mg/dL (ref 0–99)
TRIGLYCERIDES: 50 mg/dL (ref <150)
Total CHOL/HDL Ratio: 2.1 RATIO
VLDL: 10 mg/dL (ref 0–40)

## 2014-10-12 MED ORDER — FUROSEMIDE 40 MG PO TABS: ORAL_TABLET | ORAL | Status: DC

## 2014-10-12 MED ORDER — BISOPROLOL FUMARATE 5 MG PO TABS: 2.5000 mg | ORAL_TABLET | Freq: Two times a day (BID) | ORAL | Status: DC

## 2014-10-12 NOTE — Progress Notes (Signed)
Patient ID: Anne Stevens, female   DOB: 07/16/1960, 54 y.o.   MRN: [id number]  PCP: Dr. America Brown  54 yo with history of ovarian cancer with recurrence, CVA 12/11, COPD/smoking, CKD presented to the [facility] ER with dyspnea and was found to have CHF exacerbation in 5/16. Patient's ovarian cancer was diagnosed in 2013. She received liposomal doxorubicin from 4/15 to 12/15. Echo in 4/15 showed normal LV systolic function (EF 09-32%). She was noted to have recurrence with worsening peritoneal disease recently and received her first cycle of topotecan on 09/29/14. She had been short of breath for about 3 weeks prior to admission. She was short of breath just walking around her house. She had had orthopnea and episodes of PND. She was diuresed in the hospital and started on cardiac meds.  Echo showed EF 25-30% with diffuse hypokinesis.  There was severe central MR.    She returns for her post-hospital followup.  She has been doing well overall. She is staying off cigarettes.  Breathing is better.  No dyspnea walking on flat ground.  No orthopnea/PND.  No chest pain.  Occasional mild lightheadedness with standing.  She is getting topotecan infusions every Thursday.   Labs (5/16): K 4.5, creatinine 2.05, BNP 1903 => 1148, hemoglobin 8.8  ECG: NSR, nonspecific T wave flattening (narrow QRS)  Review of systems complete and found to be negative unless listed above in HPI  Past Medical History: 1. Ovarian cancer: Diagnosed in 2013, has recurred more recently with worsening peritoneal disease.  - Initial chemo with Taxol, carboplatin and later Gemzar.  - She had liposomal doxorubicin from 4/15 to 12/15.  - Topotecan began with initial dose on 09/29/14.  2. Hyperlipidemia 3. 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  4. COPD: Quit smoking 5/16.  5. CKD: Creatinine ranging 1.9-2.2 over the last few months.  6. Depression 7. Rheumatoid arthritis 8. Bilateral THR 9. Anemia: Related to chemotherapy.  10.  Cardiomyopathy: Echo (4/15) at [facility] with EF 55-60%. Echo (5/16) with EF 25-30%, moderately dilated LV, moderate diastolic dysfunction, normal RV size with mildly decreased systolic function, severe central MR likely due to annular dilatation.   SH: Quit smoking in 5/16.  Married.  Occasional ETOH and marijuana.   FH: Mother with CVA and "heart disease."  Father with "heart disease."   Current Outpatient Prescriptions  Medication Sig Dispense Refill  . ADVAIR DISKUS 500-50 MCG/DOSE AEPB Inhale 2 puffs into the lungs 2 (two) times daily as needed.    . ARIPiprazole (ABILIFY) 2 MG tablet Take 2 mg by mouth daily before breakfast.    . aspirin 325 MG tablet Take 325 mg by mouth daily.    Marland Kitchen atorvastatin (LIPITOR) 20 MG tablet Take 1 tablet (20 mg total) by mouth daily. 60 tablet 3  . bisoprolol (ZEBETA) 5 MG tablet Take 0.5 tablets (2.5 mg total) by mouth 2 (two) times daily. 60 tablet 3  . clonazePAM (KLONOPIN) 1 MG tablet Take 1 mg by mouth 2 (two) times daily as needed. anxiety    . feeding supplement, ENSURE ENLIVE, (ENSURE ENLIVE) LIQD Take 237 mLs by mouth 2 (two) times daily between meals.    . feeding supplement, RESOURCE BREEZE, (RESOURCE BREEZE) LIQD Take 1 Container by mouth daily.    . furosemide (LASIX) 40 MG tablet Take 1 tab every other day alternating with 1/2 tab every other day 30 tablet 0  . hydrALAZINE (APRESOLINE) 25 MG tablet Take 1 tablet (25 mg total) by mouth  3 (three) times daily. 90 tablet 0  . isosorbide mononitrate (IMDUR) 30 MG 24 hr tablet Take 1 tablet (30 mg total) by mouth daily. 30 tablet 0  . oxyCODONE-acetaminophen (PERCOCET) 10-325 MG per tablet Take 1 tablet by mouth every 4 (four) hours as needed. Pain 60 tablet 0  . PROAIR HFA 108 (90 BASE) MCG/ACT inhaler Inhale 2 puffs into the lungs every 4 (four) hours as needed.    . topiramate (TOPAMAX) 50 MG tablet Take 50 mg by mouth 2 (two) times daily. Takes 50 mg in the morning and 100 mg at night    .  topotecan in sodium chloride 0.9 % 100 mL Inject 1.5 mg/m2 into the vein once a week.    . venlafaxine XR (EFFEXOR-XR) 150 MG 24 hr capsule Take 300 mg by mouth daily before breakfast.    . nicotine (NICODERM CQ - DOSED IN MG/24 HOURS) 21 mg/24hr patch Place 1 patch (21 mg total) onto the skin daily. 28 patch 0   No current facility-administered medications for this encounter.   BP 92/58 mmHg  Pulse 83  Wt 137 lb (62.143 kg)  SpO2 100% General: NAD Neck: No JVD, no thyromegaly or thyroid nodule.  Lungs: Mildly decreased breath sounds bilaterally. CV: Nondisplaced PMI.  Heart regular S1/S2, no S3/S4, 1/6 HSM apex.  No peripheral edema.  No carotid bruit.  Normal pedal pulses.  Abdomen: Soft, nontender, no hepatosplenomegaly, no distention.  Skin: Intact without lesions or rashes.  Neurologic: Alert and oriented x 3.  Psych: Normal affect. Extremities: No clubbing or cyanosis.  HEENT: Normal.   Assessment/Plan: 1. Chronic systolic CHF: Echo in 5/68 with EF 25-30%, moderately dilated LV, severe central MR likely functional from annular dilatation. 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 Echo prior to doxorubicin initiation in 4/15 showed normal EF. Cannot rule out ischemic disease given her risk factors (known vascular disease with prior CVA, hyperlipidemia, active smoking). However, would not do cardiac cath at this time given CKD. NYHA class II symptoms currently with no volume overload.  - I think we can cut back a bit on Lasix, will use 40 mg daily alternating with 20 mg daily.  Check BNP today.  - No ACEI with elevated creatinine.Continue afterload reduction with hydralazine 25 mg tid + imdur 30.  - Increase bisoprolol to 2.5 mg bid (using bisoprolol for superior beta-1 selectivity with suspected COPD).   - Lexiscan Cardiolite to screen for evidence of coronary disease.  - Echo in 11/16 => if EF remains low, will need ICD.  Narrow QRS so not CRT  candidate.   2. Mitral regurgitation: Severe central MR on echo in the hospital. Probably functional from annular dilatation. Treat CHF with diuretics and afterload reduction to hopefully improve this. May be candidate for MV clipping down the road. Interestingly, murmur is much softer on today's exam.  3. CKD: Creatinine 2.05 when last checked.  This is within her baseline range.  BMET today.  She needs a nephrology referral, I will arrange.  4. Smoker: She has stayed off cigarettes.  5. CVA: Prior CVA. Continue statin and ASA 81. Check lipids today.  6. Ovarian cancer: She asks for a 2nd opinion on ovarian cancer treatment. She has been followed at [facility].  I will make her an appt at the [facility] cancer center.    Loralie Champagne 10/12/2014

## 2014-10-12 NOTE — Patient Instructions (Signed)
Increase Bisoprolol to 2.5 mg Twice daily   Decrease Furosemide to 40 mg every other day alternating with 20 mg (1/2) tab every other day   Labs today  Your physician has requested that you have a lexiscan myoview. For further information please visit [email or web address]. Please follow instruction sheet, as given.  You have been referred to [place] have been referred to [facility], we will fax them your records and they will call you to schedule appointment.  Your physician recommends that you schedule a follow-up appointment in: 3 weeks

## 2014-10-12 NOTE — Telephone Encounter (Signed)
Called pt to schedule new patient appointment, Pt is currently under the care of Dr Fay Records GYN Oncologist @ [facility]. Pt would like an appt at the [facility] cancer Ctr with GYN ONC for a second opinion. Appt has been scheduled for 10/30/14 with Dr. Denman George . Pt agreed with time and date.  Records in [place].  She is advised to continue seeing Dr. Rhodia Albright at this time since she is actively receiving chemotherapy.  Advised to call for any questions or concerns.

## 2014-10-15 ENCOUNTER — Telehealth (HOSPITAL_COMMUNITY): Payer: Self-pay | Admitting: Cardiology

## 2014-10-15 NOTE — Telephone Encounter (Signed)
Pt scheduled for [facility] per Dr. Aundra Dubin Cpt [phone] With pts current insurance- [employer] NPCR for outpatient procedures  Call ref# [id number]

## 2014-10-22 ENCOUNTER — Telehealth (HOSPITAL_COMMUNITY): Payer: Self-pay | Admitting: *Deleted

## 2014-10-22 NOTE — Telephone Encounter (Signed)
Patient given detailed instructions per Myocardial Perfusion Study Information Sheet for test on 10/27/14 at 8:30amj Patient verbalized understanding. Hubbard Robinson, RN

## 2014-10-27 ENCOUNTER — Ambulatory Visit (HOSPITAL_COMMUNITY): Payer: Medicare Other | Attending: Cardiovascular Disease

## 2014-10-27 DIAGNOSIS — R0600 Dyspnea, unspecified: Secondary | ICD-10-CM | POA: Diagnosis not present

## 2014-10-27 DIAGNOSIS — R0609 Other forms of dyspnea: Secondary | ICD-10-CM | POA: Insufficient documentation

## 2014-10-27 DIAGNOSIS — I5043 Acute on chronic combined systolic (congestive) and diastolic (congestive) heart failure: Secondary | ICD-10-CM

## 2014-10-27 LAB — MYOCARDIAL PERFUSION IMAGING
CHL CUP NUCLEAR SRS: 1
CHL CUP RESTING HR STRESS: 62 {beats}/min
LV dias vol: 259 mL
LV sys vol: 161 mL
NUC STRESS TID: 1.09
Nuc Stress EF: 38 %
Peak HR: 84 {beats}/min
RATE: 0.33
SDS: 1
SSS: 2

## 2014-10-27 MED ORDER — TECHNETIUM TC 99M SESTAMIBI GENERIC - CARDIOLITE
11.0000 | Freq: Once | INTRAVENOUS | Status: AC | PRN
  Administered 2014-10-27: 11 via INTRAVENOUS

## 2014-10-27 MED ORDER — REGADENOSON 0.4 MG/5ML IV SOLN
0.4000 mg | Freq: Once | INTRAVENOUS | Status: AC
  Administered 2014-10-27: 0.4 mg via INTRAVENOUS

## 2014-10-27 MED ORDER — TECHNETIUM TC 99M SESTAMIBI GENERIC - CARDIOLITE
33.0000 | Freq: Once | INTRAVENOUS | Status: AC | PRN
  Administered 2014-10-27: 33 via INTRAVENOUS

## 2014-10-28 ENCOUNTER — Telehealth (HOSPITAL_COMMUNITY): Payer: Self-pay | Admitting: Vascular Surgery

## 2014-10-28 NOTE — Telephone Encounter (Signed)
Pt called she wanted to know what's the hold up with her Kidney referral

## 2014-10-28 NOTE — Telephone Encounter (Signed)
Called pt let her know that [facility] did receive the referral they are behind on appts and will contact her to schedule as soon as possible.

## 2014-10-30 ENCOUNTER — Encounter: Payer: Self-pay | Admitting: Gynecologic Oncology

## 2014-10-30 ENCOUNTER — Ambulatory Visit: Payer: Medicare Other | Attending: Gynecologic Oncology | Admitting: Gynecologic Oncology

## 2014-10-30 VITALS — BP 105/69 | HR 75 | Temp 97.7°F | Resp 18 | Ht 69.0 in | Wt 143.2 lb

## 2014-10-30 DIAGNOSIS — C786 Secondary malignant neoplasm of retroperitoneum and peritoneum: Secondary | ICD-10-CM | POA: Diagnosis not present

## 2014-10-30 DIAGNOSIS — N189 Chronic kidney disease, unspecified: Secondary | ICD-10-CM | POA: Diagnosis not present

## 2014-10-30 DIAGNOSIS — I509 Heart failure, unspecified: Secondary | ICD-10-CM | POA: Diagnosis not present

## 2014-10-30 DIAGNOSIS — C569 Malignant neoplasm of unspecified ovary: Secondary | ICD-10-CM | POA: Insufficient documentation

## 2014-10-30 NOTE — Progress Notes (Signed)
New Patient Note: Gyn-Onc  CC:  Chief Complaint  Patient presents with  . Ovarian Cancer    Assessment/Plan:  [name]  is a 54 y.o.  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 She is currently receiving weekly Topotecan. She is awaiting imaging to monitor for response (is s/p cycle 2). She has a history of progression on Gemzar/Carbo, Doxil, Femara. Her recurrent disease is intraperitoneal (multifocal, predominantly LUQ). She desires transferring care to [facility] as she lives in [place] and realizes that she will be on chemotherapy for the rest of her life.  1/ Ovarian cancer: continue current regimen until reassessment with CA 125 and repeat imaging (per current treating oncologist, Dr Rhodia Albright). Discussed prognosis, and the fact that recurrent progressive ovarian cancer is not curable and that goals of therapy are palliative. In that nature I discussed that if therapy becomes more toxic than the side effects of her underlying cancer, it is reasonable to stop therapy and reinitiate treatment only with progression of symptoms. It is important to note that at present her cancer is asymptomatic. She does appear to be tolerating the Topotecan fairly well, though she does have some bone marrow toxicity. I would consider changing to weekly Taxol with a Avastin if she fails her current regimen.  We will refer the patient to my colleague, Dr Marko Plume, for ongoing care.  2/ Chronic Kidney Disease: The patient has a scheduled appointment in 1 month's time to see a nephrologist here, and. Her creatinine yesterday was 2.05.  3/ Heart Failure: EF 25-40%. In the setting of coexisting CKD this makes fluid management at the time of chemotherapy challenging. It is unclear the role her prior Doxil played in the development of this.  HPI: Anne Stevens is a 54 year old woman with a history of stage IIIC high grade serous ovarian cancer, now with recurrent progressive  disease on 4th line therapy.  Anne Stevens reports having "ovarian cancer when I was 13". She states that she was treated with an oophorectomy followed by radiation.   In 2011 she was diagnosed with stage IIIC ovarian cancer and underwent a laparotomy, hysterectomy, BSO and radical debulking procedure with Dr Rhodia Albright. Postoperatively she received 6 cycles of carboplatin and paclitaxel which was completed in February 2012.  She remained disease free until January 2014 at which time imaging of the chest, abdomen and pelvis revealed peritoneal and omental carcinomatosis. Her CA-125 was elevated to 75. She was asymptomatic and therefore not treated for an additional one year. However in January 2015 she began therapy with Gemzar and carboplatinum. She received 2 cycles of this prior to being changed to Doxil in April 2015 secondary to what appears to be dose-limiting thrombocytopenia. From 09/25/2013 to December 2015 she received Doxil (total of 9 cycles). She demonstrated progression of disease at that time and Doxil was discontinued. She was started on Femara 2.5 mg orally daily in January 2016. She developed progression as evidenced on CT imaging of progression of peritoneal disease in April 2016. On 09/28/2014 she started her first dose of weekly Topotecan. She has just received day 1 of cycle 2 of Topotecan is tolerating this well.   She is asymptomatic with respect to her underlying malignancy. She denies symptoms of abdominal bloating, early satiety, weight loss, change in bladder or bowel habit, or abdominal pain. She is overall tolerating her chemotherapy well. She's had some measurable anemia and on review of her labs from yesterday her hemoglobin is 8.  She is a resident of [place] and desires seeking care for her cancer here in town locally.  She is otherwise plagued by comorbidities including chronic kidney disease (her recent creatinine was 2.05), cerebrovascular disease (she had a CVA in 2011 which  caused dysarthria), congestive heart failure (diagnosed during admission to [facility] on 09/30/2014, and noted to have an ejection fraction of 25-30%), rheumatoid arthritis, and hip arthritis.  Her ECOG performance status is 0.    Current Meds:  Outpatient Encounter Prescriptions as of 10/30/2014  Medication Sig  . ADVAIR DISKUS 500-50 MCG/DOSE AEPB Inhale 2 puffs into the lungs 2 (two) times daily as needed.  . ARIPiprazole (ABILIFY) 2 MG tablet Take 2 mg by mouth daily before breakfast.  . aspirin 325 MG tablet Take 325 mg by mouth daily.  Marland Kitchen atorvastatin (LIPITOR) 20 MG tablet Take 1 tablet (20 mg total) by mouth daily.  . bisoprolol (ZEBETA) 5 MG tablet Take 0.5 tablets (2.5 mg total) by mouth 2 (two) times daily.  . clonazePAM (KLONOPIN) 1 MG tablet Take 1 mg by mouth 3 (three) times daily as needed. anxiety  . feeding supplement, ENSURE ENLIVE, (ENSURE ENLIVE) LIQD Take 237 mLs by mouth 2 (two) times daily between meals.  . furosemide (LASIX) 40 MG tablet Take 1 tab every other day alternating with 1/2 tab every other day  . hydrALAZINE (APRESOLINE) 25 MG tablet Take 1 tablet (25 mg total) by mouth 3 (three) times daily.  . isosorbide mononitrate (IMDUR) 30 MG 24 hr tablet Take 1 tablet (30 mg total) by mouth daily.  . nicotine (NICODERM CQ - DOSED IN MG/24 HOURS) 21 mg/24hr patch Place 1 patch (21 mg total) onto the skin daily.  Marland Kitchen oxyCODONE-acetaminophen (PERCOCET) 10-325 MG per tablet Take 1 tablet by mouth every 4 (four) hours as needed. Pain  . PROAIR HFA 108 (90 BASE) MCG/ACT inhaler Inhale 2 puffs into the lungs every 4 (four) hours as needed.  . topiramate (TOPAMAX) 100 MG tablet Take 100 mg by mouth 2 (two) times daily.  . topotecan in sodium chloride 0.9 % 100 mL Inject 1.5 mg/m2 into the vein once a week.  . venlafaxine XR (EFFEXOR-XR) 150 MG 24 hr capsule Take 300 mg by mouth daily before breakfast.  . [DISCONTINUED] topiramate (TOPAMAX) 50 MG tablet Take 50 mg by mouth 2  (two) times daily. Takes 50 mg in the morning and 100 mg at night  . feeding supplement, RESOURCE BREEZE, (RESOURCE BREEZE) LIQD Take 1 Container by mouth daily. (Patient not taking: Reported on 10/30/2014)  . [DISCONTINUED] letrozole (FEMARA) 2.5 MG tablet Take 2.5 mg by mouth.    No facility-administered encounter medications on file as of 10/30/2014.    Allergy: No Known Allergies  Social Hx:   History   Social History  . Marital Status: Married    Spouse Name: Charlotte Crumb  . Number of Children: 0  . Years of Education: HS   Occupational History  . Retired    Social History Main Topics  . Smoking status: Current Some Day Smoker -- 1.00 packs/day for 40 years    Types: Cigarettes, E-cigarettes    Last Attempt to Quit: 09/28/2014  . Smokeless tobacco: Never Used     Comment: Pt using nicotine patch intermittently   . Alcohol Use: Yes     Comment: rare  . Drug Use: Yes    Special: Marijuana, Codeine     Comment: Marijuana   . Sexual Activity: Not Currently   Other Topics  Concern  . Not on file   Social History Narrative   Patient lives at home with spouse.   Patient is right handed.   Patient has 12 th grade education.   Caffeine Use:  2 cups daily    Past Surgical Hx:  Past Surgical History  Procedure Laterality Date  . Colonoscopy    . Radial head arthroplasty Right 1980's    "for my RA"  . Joint replacement    . Total hip arthroplasty  02/05/2012    Procedure: TOTAL HIP ARTHROPLASTY;  Surgeon: Tobi Bastos, MD;  Location: [facility] ORS;  Service: Orthopedics;  Laterality: Left;  . Right oophorectomy Right 1975  . Abdominal hysterectomy  2011  . Total hip arthroplasty Bilateral 01/2011-01/2012    Past Medical Hx:  Past Medical History  Diagnosis Date  . Anxiety   . Asthma   . Neuromuscular disorder     neuropathy secondary to chemo   . Anemia     hx of   . Rheumatoid arthritis   . Hyperlipidemia   . CHF (congestive heart failure) dx'd 09/30/2014  . Heart murmur      "thought I grew out of it but dr. heard it today" (09/30/2014)  . COPD (chronic obstructive pulmonary disease)   . History of blood transfusion "several times"  . Migraine     "stopped ~ 2012" (09/30/2014)  . Stroke 04/2010    right arm and leg numbness initially; right arm still numb" (09/30/2014)  . Depression   . Ovarian cancer     1975; 2011; 04/2012 ; Chemotherapy 05/2012 through December 2015 (09/30/2014)  . Chronic bronchitis     "I've had it several times but didn't get it this year" (09/30/2014)    Past Gynecological History:  G0 - infertile after childhood pelvic RT.  No LMP recorded. Patient has had a hysterectomy.  Family Hx:  Family History  Problem Relation Age of Onset  . Stroke Mother   . High blood pressure Mother   . High Cholesterol Mother   . Heart Problems Mother   . Rheum arthritis Mother   . Lung cancer Father   . Heart Problems Father   . Diabetes Father     Review of Systems:  Constitutional  Feels well,   ENT Normal appearing ears and nares bilaterally Skin/Breast  No rash, sores, jaundice, itching, dryness Cardiovascular  No chest pain, shortness of breath, or edema  Pulmonary  No cough or wheeze.  Gastro Intestinal  No nausea, vomitting, or diarrhoea. No bright red blood per rectum, no abdominal pain, change in bowel movement, or constipation.  Genito Urinary  No frequency, urgency, dysuria, see HPI Musculo Skeletal  No myalgia, arthralgia, joint swelling or pain  Neurologic  No weakness, numbness, change in gait,  Psychology  No depression, anxiety, insomnia.   Vitals:  Blood pressure 105/69, pulse 75, temperature 97.7 F (36.5 C), temperature source Oral, resp. rate 18, height 5\' 9"  (1.753 m), weight 143 lb 3.2 oz (64.955 kg).  Physical Exam: WD in NAD Neck  Supple NROM, without any enlargements.  Lymph Node Survey No cervical supraclavicular or inguinal adenopathy Cardiovascular  Pulse normal rate, regularity and rhythm. S1 and S2  normal.  Lungs  Clear to auscultation bilateraly, without wheezes/crackles/rhonchi. Good air movement.  Skin  No rash/lesions/breakdown  Psychiatry  Alert and oriented to person, place, and time  Abdomen  Normoactive bowel sounds, abdomen soft, non-tender and thin without evidence of hernia.  Back No CVA tenderness  Genito Urinary  Vulva/vagina: Normal external female genitalia.   No lesions. No discharge or bleeding.  Bladder/urethra:  No lesions or masses, well supported bladder  Vagina: normal, loss of rugation, no lesions  Cervix: surgically absent.  Uterus: surgically absent.  Adnexa: surgically absent. Rectal  Good tone, no masses no cul de sac nodularity.  Extremities  No bilateral cyanosis, clubbing or edema.   Donaciano Eva, MD   10/30/2014, 6:22 PM

## 2014-10-30 NOTE — Patient Instructions (Signed)
Continue treatments and scan with Dr. Rhodia Albright.  Plan to see Dr. Marko Plume at the [facility] in [place] on November 19, 2014. Please call us at [phone] prior to this appointment with any questions or concerns you have.

## 2014-11-02 ENCOUNTER — Telehealth: Payer: Self-pay | Admitting: *Deleted

## 2014-11-02 ENCOUNTER — Ambulatory Visit (HOSPITAL_COMMUNITY)
Admission: RE | Admit: 2014-11-02 | Discharge: 2014-11-02 | Disposition: A | Discharge status: Home / Self Care | Payer: Medicare Other | Source: Ambulatory Visit | Attending: Internal Medicine | Admitting: Internal Medicine

## 2014-11-02 VITALS — BP 106/72 | HR 83 | Wt 137.1 lb

## 2014-11-02 DIAGNOSIS — Z8673 Personal history of transient ischemic attack (TIA), and cerebral infarction without residual deficits: Secondary | ICD-10-CM | POA: Insufficient documentation

## 2014-11-02 DIAGNOSIS — F17200 Nicotine dependence, unspecified, uncomplicated: Secondary | ICD-10-CM | POA: Insufficient documentation

## 2014-11-02 DIAGNOSIS — F17210 Nicotine dependence, cigarettes, uncomplicated: Secondary | ICD-10-CM | POA: Diagnosis not present

## 2014-11-02 DIAGNOSIS — I429 Cardiomyopathy, unspecified: Secondary | ICD-10-CM | POA: Diagnosis not present

## 2014-11-02 DIAGNOSIS — J449 Chronic obstructive pulmonary disease, unspecified: Secondary | ICD-10-CM | POA: Insufficient documentation

## 2014-11-02 DIAGNOSIS — F329 Major depressive disorder, single episode, unspecified: Secondary | ICD-10-CM | POA: Diagnosis not present

## 2014-11-02 DIAGNOSIS — N183 Chronic kidney disease, stage 3 unspecified: Secondary | ICD-10-CM

## 2014-11-02 DIAGNOSIS — Z7982 Long term (current) use of aspirin: Secondary | ICD-10-CM | POA: Insufficient documentation

## 2014-11-02 DIAGNOSIS — E785 Hyperlipidemia, unspecified: Secondary | ICD-10-CM | POA: Diagnosis not present

## 2014-11-02 DIAGNOSIS — Z79899 Other long term (current) drug therapy: Secondary | ICD-10-CM | POA: Insufficient documentation

## 2014-11-02 DIAGNOSIS — I5022 Chronic systolic (congestive) heart failure: Secondary | ICD-10-CM | POA: Diagnosis present

## 2014-11-02 DIAGNOSIS — Z720 Tobacco use: Secondary | ICD-10-CM | POA: Diagnosis not present

## 2014-11-02 DIAGNOSIS — N189 Chronic kidney disease, unspecified: Secondary | ICD-10-CM | POA: Diagnosis not present

## 2014-11-02 DIAGNOSIS — D6481 Anemia due to antineoplastic chemotherapy: Secondary | ICD-10-CM | POA: Insufficient documentation

## 2014-11-02 DIAGNOSIS — I5043 Acute on chronic combined systolic (congestive) and diastolic (congestive) heart failure: Secondary | ICD-10-CM | POA: Diagnosis not present

## 2014-11-02 DIAGNOSIS — I340 Nonrheumatic mitral (valve) insufficiency: Secondary | ICD-10-CM | POA: Diagnosis not present

## 2014-11-02 DIAGNOSIS — C569 Malignant neoplasm of unspecified ovary: Secondary | ICD-10-CM | POA: Insufficient documentation

## 2014-11-02 DIAGNOSIS — M069 Rheumatoid arthritis, unspecified: Secondary | ICD-10-CM | POA: Diagnosis not present

## 2014-11-02 MED ORDER — BISOPROLOL FUMARATE 5 MG PO TABS: 5.0000 mg | ORAL_TABLET | Freq: Every day | ORAL | Status: DC

## 2014-11-02 MED ORDER — FUROSEMIDE 20 MG PO TABS: 20.0000 mg | ORAL_TABLET | Freq: Every day | ORAL | Status: DC

## 2014-11-02 MED ORDER — ATORVASTATIN CALCIUM 20 MG PO TABS: 20.0000 mg | ORAL_TABLET | Freq: Every day | ORAL | Status: DC

## 2014-11-02 MED ORDER — HYDRALAZINE HCL 25 MG PO TABS: 25.0000 mg | ORAL_TABLET | Freq: Three times a day (TID) | ORAL | Status: DC

## 2014-11-02 MED ORDER — ISOSORBIDE MONONITRATE ER 30 MG PO TB24: 30.0000 mg | ORAL_TABLET | Freq: Every day | ORAL | Status: DC

## 2014-11-02 NOTE — Progress Notes (Signed)
Advanced Heart Failure Medication Review by a Pharmacist  Does the patient  feel that his/her medications are working for him/her?  yes  Has the patient been experiencing any side effects to the medications prescribed?  no  Does the patient measure his/her own blood pressure or blood glucose at home?  no   Does the patient have any problems obtaining medications due to transportation or finances?   no  Understanding of regimen: fair Understanding of indications: fair Potential of compliance: good    Pharmacist comments: Patient presents to heart failure clinic and medications were reviewed with a pharmacist. Of note, she is taking her bisoprolol 5mg  once a day instead of 2.5mg  BID - HR in 80s today and BP stable. Patient states that she usually gets her atorvastatin from a PCP that she stopped seeing and that she doesn't have an appointment with her new one until August and would like refills to carry her through until that appointment.   Anne Stevens E. Jerl Munyan, Pharm.D Clinical Pharmacy Resident Pager: [phone] 11/02/2014 12:23 PM

## 2014-11-02 NOTE — Telephone Encounter (Signed)
Notified pt of appointments scheduled on 12/01/2014 . Pt agreed with time and date

## 2014-11-02 NOTE — Patient Instructions (Signed)
CHANGE Bisoprolol to 5 mg, one tab daily CHANGE Lasix to 20 mg daily, hold for weight less than 137  We have refilled hydralazine, Imdur, and lipitor   Your physician recommends that you schedule a follow-up appointment in: 6 weeks

## 2014-11-02 NOTE — Progress Notes (Signed)
Patient ID: Anne Stevens, female   DOB: [birth year], 54 y.o.   MRN: [id number]  PCP: Dr. America Brown  54 yo with history of ovarian cancer with recurrence, CVA 12/11, COPD/smoking, CKD presented to the [facility] ER with dyspnea and was found to have CHF exacerbation in 5/16. Patient's ovarian cancer was diagnosed in 2013. She received liposomal doxorubicin from 4/15 to 12/15. Echo in 4/15 showed normal LV systolic function (EF 00-76%). She was noted to have recurrence with worsening peritoneal disease recently and received her first cycle of topotecan on 09/29/14. She had been short of breath for about 3 weeks prior to admission. She was short of breath just walking around her house. She had had orthopnea and episodes of PND. She was diuresed in the hospital and started on cardiac meds.  Echo showed EF 25-30% with diffuse hypokinesis.  There was severe central MR.    She returns for regular followup.  Pt is depressed as she found out Friday that she would be on long term chemotherapy for her ovarian cancer.   She is having dyspnea on flat ground, and states she becomes SOB when she walks out to the mailbox (approx 50 yards). Weight at home 140 pounds. She is back to smoking 1/2 ppd of cigarettes.  No orthopnea/PND.  No chest pain.  Still has occasional mild lightheadedness with standing.  She is getting topotecan infusions every Thursday.  She is on list for [facility] referral.   Labs (10/05/14): K 4.5, creatinine 2.05, BNP 1903 => 1148, hemoglobin 8.8 Labs (10/12/14) : K 5.3, creatinine 2.44, BNP 753.6  Review of systems complete and found to be negative unless listed above in HPI  Past Medical History: 1. Ovarian cancer: Diagnosed in 2013, has recurred more recently with worsening peritoneal disease.  - Initial chemo with Taxol, carboplatin and later Gemzar.  - She had liposomal doxorubicin from 4/15 to 12/15.  - Topotecan began with initial dose on 09/29/14.  2. Hyperlipidemia 3. 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  4. COPD: Quit smoking 5/16.  5. CKD: Creatinine ranging 1.9-2.2 over the last few months.  6. Depression 7. Rheumatoid arthritis 8. Bilateral THR 9. Anemia: Related to chemotherapy.  10. Cardiomyopathy: Echo (4/15) at [facility] with EF 55-60%. Echo (5/16) with EF 25-30%, moderately dilated LV, moderate diastolic dysfunction, normal RV size with mildly decreased systolic function, severe central MR likely due to annular dilatation.   SH: Quit smoking in 5/16.  Married.  Occasional ETOH and marijuana.   FH: Mother with CVA and "heart disease."  Father with "heart disease."   Current Outpatient Prescriptions  Medication Sig Dispense Refill  . ADVAIR DISKUS 500-50 MCG/DOSE AEPB Inhale 2 puffs into the lungs 2 (two) times daily as needed.    . ARIPiprazole (ABILIFY) 2 MG tablet Take 2 mg by mouth daily before breakfast.    . aspirin 325 MG tablet Take 325 mg by mouth daily.    Marland Kitchen atorvastatin (LIPITOR) 20 MG tablet Take 1 tablet (20 mg total) by mouth daily. 60 tablet 3  . clonazePAM (KLONOPIN) 1 MG tablet Take 1 mg by mouth 3 (three) times daily as needed. anxiety    . feeding supplement, ENSURE ENLIVE, (ENSURE ENLIVE) LIQD Take 237 mLs by mouth 2 (two) times daily between meals.    . furosemide (LASIX) 40 MG tablet Take 1 tab every other day alternating with 1/2 tab every other day 30 tablet 0  . hydrALAZINE (APRESOLINE) 25 MG tablet Take  1 tablet (25 mg total) by mouth 3 (three) times daily. 90 tablet 0  . isosorbide mononitrate (IMDUR) 30 MG 24 hr tablet Take 1 tablet (30 mg total) by mouth daily. 30 tablet 0  . oxyCODONE-acetaminophen (PERCOCET) 10-325 MG per tablet Take 1 tablet by mouth every 4 (four) hours as needed. Pain 60 tablet 0  . PROAIR HFA 108 (90 BASE) MCG/ACT inhaler Inhale 2 puffs into the lungs every 4 (four) hours as needed.    . topiramate (TOPAMAX) 100 MG tablet Take 100 mg by mouth 2 (two) times daily.  10  . topotecan in sodium chloride  0.9 % 100 mL Inject 1.5 mg/m2 into the vein once a week.    . venlafaxine XR (EFFEXOR-XR) 150 MG 24 hr capsule Take 300 mg by mouth daily before breakfast.    . bisoprolol (ZEBETA) 5 MG tablet Take 0.5 tablets (2.5 mg total) by mouth 2 (two) times daily. 60 tablet 3  . feeding supplement, RESOURCE BREEZE, (RESOURCE BREEZE) LIQD Take 1 Container by mouth daily. (Patient not taking: Reported on 10/30/2014)    . nicotine (NICODERM CQ - DOSED IN MG/24 HOURS) 21 mg/24hr patch Place 1 patch (21 mg total) onto the skin daily. (Patient not taking: Reported on 11/02/2014) 28 patch 0   No current facility-administered medications for this encounter.   BP 106/72 mmHg  Pulse 83  Wt 137 lb 1.9 oz (62.197 kg)  SpO2 100% General: NAD Neck: No JVD, no thyromegaly or thyroid nodule.  Lungs: Mildly decreased breath sounds bilaterally. CV: Nondisplaced PMI.  Heart regular S1/S2, no S3/S4, 1/6 HSM apex.  No peripheral edema.  No carotid bruit.  Normal pedal pulses.  Abdomen: Soft, nontender, no hepatosplenomegaly, no distention.  Skin: Intact without lesions or rashes.  Neurologic: Alert and oriented x 3.  Psych: Normal affect. Extremities: No clubbing or cyanosis.  HEENT: Normal.   Assessment/Plan: 1. Chronic systolic CHF: Echo in 2/63 with EF 25-30%, moderately dilated LV, severe central MR likely functional from annular dilatation. 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 Echo prior to doxorubicin initiation in 4/15 showed normal EF. Lexiscan with no evidence of ischemia. 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    NYHA class II-III symptoms currently with no volume overload.  - Fluid a little low. Continue Lasix, but cut back to 20 mg daily. I have asked her to hold lasix for weight less than 137 pounds.  - No ACEI or spirowith elevated creatinine.Continue afterload reduction with hydralazine 25 mg tid + imdur 30 mg.  - Continue bisoprolol 5 mg daily  (using bisoprolol for  superior beta-1 selectivity with suspected COPD).   - Lexiscan Cardiolite to screen for evidence of ischemia.   - Echo 10/01/14 EF of 25-30%   With evidence peritoneal disease will not pursue ICD. Discussed with Dr Haroldine Laws.   2. Mitral regurgitation: Severe central MR on echo in the hospital. Probably functional from annular dilatation. Treat CHF with diuretics and afterload reduction to hopefully improve this. May be candidate for MV clipping down the road.  3. CKD: Creatinine 2.05>2.44  when last checked. Lasix cut back today.  Nephrology consult pending.   4. Smoker: Started smoking again.  Encourage to stop smoking.  5. CVA: Prior CVA. Continue Lipitor 20 mg and ASA 81. Lipid panel normal 10/12/2014.  6. Ovarian cancer: - Continued treatment at [facility].   Follow up in 3 months.    Wrenley Sayed  NP-C    11/02/2014

## 2014-11-04 ENCOUNTER — Other Ambulatory Visit (HOSPITAL_COMMUNITY): Payer: Self-pay | Admitting: *Deleted

## 2014-11-04 DIAGNOSIS — I5043 Acute on chronic combined systolic (congestive) and diastolic (congestive) heart failure: Secondary | ICD-10-CM

## 2014-11-04 MED ORDER — BISOPROLOL FUMARATE 5 MG PO TABS: 5.0000 mg | ORAL_TABLET | Freq: Every day | ORAL | Status: DC

## 2014-11-04 MED ORDER — ISOSORBIDE MONONITRATE ER 30 MG PO TB24: 30.0000 mg | ORAL_TABLET | Freq: Every day | ORAL | Status: DC

## 2014-11-04 MED ORDER — HYDRALAZINE HCL 25 MG PO TABS: 25.0000 mg | ORAL_TABLET | Freq: Three times a day (TID) | ORAL | Status: AC

## 2014-11-04 MED ORDER — FUROSEMIDE 20 MG PO TABS: 20.0000 mg | ORAL_TABLET | Freq: Every day | ORAL | Status: DC

## 2014-11-04 MED ORDER — ATORVASTATIN CALCIUM 20 MG PO TABS: 20.0000 mg | ORAL_TABLET | Freq: Every day | ORAL | Status: AC

## 2014-11-05 ENCOUNTER — Telehealth (HOSPITAL_COMMUNITY): Payer: Self-pay | Admitting: *Deleted

## 2014-11-05 NOTE — Telephone Encounter (Signed)
Message left. Received medical clearance from oncologist ok for pt to participate in CR. Will plan to do a one on one orientation on Tuesday afternoon per pt availability.  Contact information left for pt to please call and schedule. Cherre Huger, BSN

## 2014-11-06 ENCOUNTER — Telehealth: Payer: Self-pay | Admitting: Hematology

## 2014-11-11 NOTE — Telephone Encounter (Signed)
No note

## 2014-11-15 ENCOUNTER — Other Ambulatory Visit: Payer: Self-pay | Admitting: Neurology

## 2014-11-19 ENCOUNTER — Ambulatory Visit: Payer: Medicare Other

## 2014-11-19 ENCOUNTER — Other Ambulatory Visit: Payer: Medicare Other

## 2014-11-19 ENCOUNTER — Ambulatory Visit: Payer: Medicare Other | Admitting: Oncology

## 2014-11-24 ENCOUNTER — Ambulatory Visit (HOSPITAL_COMMUNITY): Payer: Medicare Other

## 2014-11-28 ENCOUNTER — Other Ambulatory Visit: Payer: Self-pay | Admitting: Neurology

## 2014-11-29 NOTE — Telephone Encounter (Signed)
Prescribed by Jolaine Artist, MD on 06/08

## 2014-11-30 ENCOUNTER — Other Ambulatory Visit: Payer: Self-pay | Admitting: Oncology

## 2014-11-30 DIAGNOSIS — C569 Malignant neoplasm of unspecified ovary: Secondary | ICD-10-CM

## 2014-12-01 ENCOUNTER — Ambulatory Visit (HOSPITAL_BASED_OUTPATIENT_CLINIC_OR_DEPARTMENT_OTHER): Payer: Medicare Other | Admitting: Oncology

## 2014-12-01 ENCOUNTER — Other Ambulatory Visit (HOSPITAL_BASED_OUTPATIENT_CLINIC_OR_DEPARTMENT_OTHER): Payer: Medicare Other

## 2014-12-01 ENCOUNTER — Encounter (INDEPENDENT_AMBULATORY_CARE_PROVIDER_SITE_OTHER): Payer: Self-pay

## 2014-12-01 ENCOUNTER — Ambulatory Visit: Payer: Medicare Other

## 2014-12-01 ENCOUNTER — Encounter: Payer: Self-pay | Admitting: Oncology

## 2014-12-01 VITALS — BP 116/70 | HR 80 | Temp 97.8°F | Resp 18 | Ht 69.0 in | Wt 139.2 lb

## 2014-12-01 DIAGNOSIS — Z95828 Presence of other vascular implants and grafts: Secondary | ICD-10-CM

## 2014-12-01 DIAGNOSIS — E28319 Asymptomatic premature menopause: Secondary | ICD-10-CM

## 2014-12-01 DIAGNOSIS — I69359 Hemiplegia and hemiparesis following cerebral infarction affecting unspecified side: Secondary | ICD-10-CM

## 2014-12-01 DIAGNOSIS — D631 Anemia in chronic kidney disease: Secondary | ICD-10-CM | POA: Diagnosis not present

## 2014-12-01 DIAGNOSIS — C569 Malignant neoplasm of unspecified ovary: Secondary | ICD-10-CM

## 2014-12-01 DIAGNOSIS — C786 Secondary malignant neoplasm of retroperitoneum and peritoneum: Secondary | ICD-10-CM

## 2014-12-01 DIAGNOSIS — N183 Chronic kidney disease, stage 3 unspecified: Secondary | ICD-10-CM

## 2014-12-01 DIAGNOSIS — M859 Disorder of bone density and structure, unspecified: Secondary | ICD-10-CM

## 2014-12-01 DIAGNOSIS — M069 Rheumatoid arthritis, unspecified: Secondary | ICD-10-CM

## 2014-12-01 DIAGNOSIS — T451X5A Adverse effect of antineoplastic and immunosuppressive drugs, initial encounter: Secondary | ICD-10-CM

## 2014-12-01 DIAGNOSIS — C562 Malignant neoplasm of left ovary: Secondary | ICD-10-CM | POA: Diagnosis present

## 2014-12-01 DIAGNOSIS — D6481 Anemia due to antineoplastic chemotherapy: Secondary | ICD-10-CM

## 2014-12-01 DIAGNOSIS — Z720 Tobacco use: Secondary | ICD-10-CM

## 2014-12-01 DIAGNOSIS — M8580 Other specified disorders of bone density and structure, unspecified site: Secondary | ICD-10-CM

## 2014-12-01 DIAGNOSIS — I509 Heart failure, unspecified: Secondary | ICD-10-CM

## 2014-12-01 LAB — CBC WITH DIFFERENTIAL/PLATELET
BASO%: 0.9 % (ref 0.0–2.0)
Basophils Absolute: 0 10*3/uL (ref 0.0–0.1)
EOS ABS: 0.1 10*3/uL (ref 0.0–0.5)
EOS%: 2 % (ref 0.0–7.0)
HEMATOCRIT: 26 % — AB (ref 34.8–46.6)
HGB: 8.3 g/dL — ABNORMAL LOW (ref 11.6–15.9)
LYMPH%: 37.3 % (ref 14.0–49.7)
MCH: 31.2 pg (ref 25.1–34.0)
MCHC: 31.9 g/dL (ref 31.5–36.0)
MCV: 97.7 fL (ref 79.5–101.0)
MONO#: 0 10*3/uL — ABNORMAL LOW (ref 0.1–0.9)
MONO%: 0.9 % (ref 0.0–14.0)
NEUT%: 58.9 % (ref 38.4–76.8)
NEUTROS ABS: 2.1 10*3/uL (ref 1.5–6.5)
PLATELETS: 243 10*3/uL (ref 145–400)
RBC: 2.66 10*6/uL — ABNORMAL LOW (ref 3.70–5.45)
RDW: 16.8 % — ABNORMAL HIGH (ref 11.2–14.5)
WBC: 3.5 10*3/uL — ABNORMAL LOW (ref 3.9–10.3)
lymph#: 1.3 10*3/uL (ref 0.9–3.3)
nRBC: 0 % (ref 0–0)

## 2014-12-01 LAB — COMPREHENSIVE METABOLIC PANEL (CC13)
ALT: 21 U/L (ref 0–55)
AST: 15 U/L (ref 5–34)
Albumin: 3.3 g/dL — ABNORMAL LOW (ref 3.5–5.0)
Alkaline Phosphatase: 104 U/L (ref 40–150)
Anion Gap: 8 mEq/L (ref 3–11)
BUN: 44.2 mg/dL — AB (ref 7.0–26.0)
CALCIUM: 9 mg/dL (ref 8.4–10.4)
CHLORIDE: 116 meq/L — AB (ref 98–109)
CO2: 18 meq/L — AB (ref 22–29)
Creatinine: 1.8 mg/dL — ABNORMAL HIGH (ref 0.6–1.1)
EGFR: 31 mL/min/{1.73_m2} — ABNORMAL LOW (ref >90)
Glucose: 112 mg/dl (ref 70–140)
Potassium: 4.2 mEq/L (ref 3.5–5.1)
Sodium: 142 mEq/L (ref 136–145)
Total Bilirubin: 0.35 mg/dL (ref 0.20–1.20)
Total Protein: 6.6 g/dL (ref 6.4–8.3)

## 2014-12-01 NOTE — Progress Notes (Signed)
Checked in new pt with no financial concerns prior to seeing the dr.

## 2014-12-01 NOTE — Progress Notes (Signed)
[facility] NEW PATIENT EVALUATION   Name: Anne Stevens Date: December 01, 2014  MRN: [id number] DOB: 02-16-1961  REFERRING PHYSICIAN: Everitt Amber  CC: Fay Records, Loralie Champagne, Ron Gladstone Lighter,  Hassie Bruce (rheumatology [facility]), Leonie Man (neurology). Referral in process to [facility] per recent cardiology note. PCP in [place], new patient appointment scheduled for August, possibly Dr Geanie Logan  Records from [facility] dating 01-2010 thru 11-05-14 received, ~ 500 pages, with detailed review of initial surgery/ pathology and last 6 months office notes including scans 05-2014 and 08-2014. Records not available from right oophorectomy 1975 in [facility] and subsequent radiation therapy at [facility]. Records reviewed in this EMR from recent hospitalization and cardiology information.  REASON FOR REFERRAL: recurrent ovarian cancer, for assistance with care in [place] is a 54 y.o. female who is seen in consultation, together with husband, at the request of patient and Dr Aundra Dubin. She is under active treatment by Dr Hessie Dibble at [facility]; She initially saw Dr Everitt Amber at [facility], however need for local care is likely to be primarily related to chemotherapy such that she is now establishing with me also. Patient tells me that Dr Rhodia Albright is aware of these visits; she plans to complete at least cycle 3 topotecan at [facility] (day 8 cycle 3 due 12-03-14 and day 15 due 12-09-13), then will have restaging scans at [facility] and discuss with Dr Rhodia Albright.   Patient reports history of "ovarian cancer"  age 54, treated with right oophorectomy [facility] in [facility], followed by pelvic radiation therapy at [facility]. 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 She had no menstrual periods following the radiation. In 01-2010 she was found to have left ovarian neoplasm, with exploratory  laparotomy with lysis of adhesions, type 2 radical hysterectomy with optimal radical cytoreduction of pelvic disease including left salpingectomy, infracolic omentectomy and resection of proximal ileum metastatic disease of 3-4 cm, this by Dr Rhodia Albright at [facility] 02-11-2010.  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 Cytology of pelvic washings positive for adenocarcinoma. She had adjuvant carboplatin taxol x 6 cycles thru 06-2010, then was NED until Jan 2014, when she had peritoneal and omental carcinomatosis with CA 125 of 75. As she was asymptomatic, she had no treatment for a year after the recurrence. Beginning Jan 2015 she had 2 cycles of gemzar carboplatin, then doxil x 9 cycles from 08-2013 thru 04-2014. Echocardiogram 08-2013 reportedly had EF 55-60%. She was on Femara from Jan 2016 thru April 2015 when further progression. She received first topotecan 09-28-2014; day 1 cycle 3 topotecan last week was given with increased dose, which she did not tolerate as well. She is scheduled to complete present cycle of topotecan, then will have restaging scans by Dr Rhodia Albright. Most recent prior scan was CT CAP at [facility] 09-24-14, done without IV contrast. She has needed several PRBC transfusions by Dr Rhodia Albright thru recent chemo, including one unit each of last 2 weeks, 09-2014 and fall 2015.Marland Kitchen  She has PAC in, double lumen placed ~ 2 years ago at [facility]. 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 She was admitted in [facility] system 5-4 thru 10-03-14 with acute congestive heart failure, with BNP on admission 1903, diuresed, evaluation by cardiology. LLE edema >RLE, venous dopplers negative for DVT.       REVIEW OF SYSTEMS: More fatigue after increased dose of topotecan last week, stayed mostly in bed, more nausea. Feels weak and SOB when needs PRBC transfusions, per husband does not tolerate <7.4, denies those symptoms now. No bleeding, does bruise easily on arms with ASA. [place] a few weeks ago onto back on floor, needed cane due to right hip pain after the fall but walking more easily today and no pain sitting. Right facial droop and right upper and LE weak and decreased sensation, dysarthria since CVA 2011 (treated at [facility]); does not regularly need cane other than with hip pain above. Bowels move regularly with stool softener/ occasional dulcolax tablet. Not much diarrhea with this chemo. Bladder functioning well, especially as recently begun on lasix. Weight steady, eating ok including one Boost Breeze daily. No HA. Reading glasses. No difficulty hearing. No acute dental problems tho needs cleaning which has been delayed with chemo, does have dentist. No thyroid symptoms. RA since age 58. Some cough, briefly quit smoking in hospital with cardiac problems then resumed. Swelling RLE> LLE, better with lasix.   Remainder of full 10 point review of systems negative.   ALLERGIES: Review of patient's allergies indicates no known allergies.  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, followed by pelvic RT at [facility] hospital Did not have menses after pelvic RT age 75, 31 RA since age 65, followed by rheumatology at [facility], on multiple drugs over years, no steroids in >1 year Low bone density by scans in past, not done recently Bilateral total hip replacements by [facility] 2012-2013 Rib fx on CT 08-2014 Asthma Anxiety/  depression Double lumen PAC placed at [facility] ~ 2 years ago CVA: L MCA 2011 with residual dysarthria, weakness and decreased sensation RUE and RLE Chronic kidney disease with baseline creatinine ~ 2.0  CHF: diagnosed during admission to [facility] 09-2014, EF 25-30% (had been 55-60% by echo 4-20150. Severe central MR Multiple PRBC transfusions at [facility] "does not tolerate hgb <7.4"  CURRENT MEDICATIONS: reviewed as listed now in EMR  [place]:  Originally from [place], lives with husband who is employed part time with flexible schedule. Worked with [employer] and retired from [employer] in 2010. Smoked 1 ppd x 40 years, quit x 2 weeks at time of hospitalization with cardiac issues in 09-2014, but has resumed now at < 1ppd. Occasional ETOH. Several blood transfusions in past year. No children, infertile since pelvic RT age 31. High school education.  Other family locally not too involved. Has not completed Advance Directives.   FAMILY HISTORY:  Mother RA, CVA, HTN, cardiac disease, elevated cholesterol Father lung ca, cardiac disease, diabetes 2 brothers with CAD          PHYSICAL EXAM:  height is _0  (1.753 m) and weight is 139 lb 3.2 oz (63.141 kg). Her oral temperature is 97.8 F (36.6 C). Her blood pressure is 116/70 and her pulse is 80. Her respiration is 18 and oxygen saturation  is 99%.  Alert, pleasant, cooperative lady, looks generally debilitated and older than stated age, good historian despite dysarthria. Respirations not labored RA, seated in WC for all of history and exam. Smells of tobacco.  Husband very supportive.  HEENT: PERRL, not icteric. Oral mucosa moist and clear. No obvious dental concerns. Posterior pharynx clear. Neck supple without JVD.   RESPIRATORY: respirations not labored with minimal activity. No cough or wheeze. Diminished BS thruout otherwise clear to A and P.  CARDIAC/ VASCULAR:heart RRR no gallop. Peripheral  pulses intact and symmetrical  ABDOMEN: soft, not distended, not tender. No appreciable HSM. Few BS normally active  LYMPH NODES: no cervical, supraclavicular, axillary or inguinal adenopathy  BREASTS: bilaterally without dominant mass,skin or nipple findings of concern.  NEUROLOGIC: right facial droop. Dysarthric. RUE and RLE weakness.   SKIN: No rash or petechiae. No bruising back or hips in area of trauma from recent fall  MUSCULOSKELETAL: diminished muscle mass in extremities. Spine not tender.     LABORATORY DATA:  Results for orders placed or performed in visit on 12/01/14 (from the past 48 hour(s))  CBC with Differential     Status: Abnormal   Collection Time: 12/01/14  9:45 AM  Result Value Ref Range   WBC 3.5 (L) 3.9 - 10.3 10e3/uL   NEUT# 2.1 1.5 - 6.5 10e3/uL   HGB 8.3 (L) 11.6 - 15.9 g/dL   HCT 26.0 (L) 34.8 - 46.6 %   Platelets 243 145 - 400 10e3/uL   MCV 97.7 79.5 - 101.0 fL   MCH 31.2 25.1 - 34.0 pg   MCHC 31.9 31.5 - 36.0 g/dL   RBC 2.66 (L) 3.70 - 5.45 10e6/uL   RDW 16.8 (H) 11.2 - 14.5 %   lymph# 1.3 0.9 - 3.3 10e3/uL   MONO# 0.0 (L) 0.1 - 0.9 10e3/uL   Eosinophils Absolute 0.1 0.0 - 0.5 10e3/uL   Basophils Absolute 0.0 0.0 - 0.1 10e3/uL   NEUT% 58.9 38.4 - 76.8 %   LYMPH% 37.3 14.0 - 49.7 %   MONO% 0.9 0.0 - 14.0 %   EOS% 2.0 0.0 - 7.0 %   BASO% 0.9 0.0 - 2.0 %   nRBC 0 0 - 0 %  Comprehensive metabolic panel (Cmet) - [facility]     Status: Abnormal   Collection Time: 12/01/14  9:45 AM  Result Value Ref Range   Sodium 142 136 - 145 mEq/L   Potassium 4.2 3.5 - 5.1 mEq/L   Chloride 116 (H) 98 - 109 mEq/L   CO2 18 (L) 22 - 29 mEq/L   Glucose 112 70 - 140 mg/dl   BUN 44.2 (H) 7.0 - 26.0 mg/dL   Creatinine 1.8 (H) 0.6 - 1.1 mg/dL   Total Bilirubin 0.35 0.20 - 1.20 mg/dL   Alkaline Phosphatase 104 40 - 150 U/L   AST 15 5 - 34 U/L   ALT 21 0 - 55 U/L   Total Protein 6.6 6.4 - 8.3 g/dL   Albumin 3.3 (L) 3.5 - 5.0 g/dL   Calcium 9.0 8.4 - 10.4 mg/dL    Anion Gap 8 3 - 11 mEq/L   EGFR 31 (L) >90 ml/min/1.73 m2    Comment: eGFR is calculated using the CKD-EPI Creatinine Equation (2009)      PATHOLOGY: 02-11-2010 [facility] [phone] cytology and [id number] surgical path) had 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 Cytology  of pelvic washings positive for adenocarcinoma.    Reports to be scanned into this EMR  RADIOGRAPHY: Report of CT CAP without IV contrast done at [facility] 09-24-14 summarized, will be scanned into this EMR (report in body of clinic note 09-24-14): C/w 06-11-14 Chest with small pericardial effusion, bilateral pleural effusions, lungs with interstitial edema, PAC in right atrium, similar mediastinal nodes. Healing rib fractures. Abdomen with peritoneal implants on surface of liver, GB/spleen/adrenals not remarkable, no renal obstruction, increase in ascites and peritoneal nodularity, new serosal deposit on sigmoid colon 4.2 cm, no bowel obstruction, diverticulosis, aortoiliac atherosclerosis Pelvis small ascites, ureters and bladder not remarkable, post hysterectomy. Bilateral hip arthroplasties, osteopenia   DISCUSSION: All of history above reviewed.  I have told patient and husband that I am glad to work with Dr Rhodia Albright if some treatments, lab work, symptom management would be more convenient in [place].  Discussed smoking cessation techniques. Husband also smokes.  We have talked at length about Advance Directives and her wishes as to life support and resuscitation. I have explained that those interventions are useful in situations where, just given more time, a person is likely to recover from the illness. In cases of advanced cancer, especially with comorbid conditions, life support will do nothing to improve the underlying malignancy. I have been clear that other care short of resuscitation / life  support can still be used even if DNR requested. I have explained that written documents for Advance Directives are particularly helpful if patient is unable to verbalize wishes, as if EMS came to home, but that MD and family should also know patient's wishes even if not forms have not been completed.  They seemed to understand this conversation and patient tells me that she will consider the issue.  Rather than setting up another appointment now, they prefer to call this MD or RN after meeting with Dr Rhodia Albright about upcoming restaging CTs.     IMPRESSION / PLAN:  1. 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  Treatment since recurrence as documented above. Patient would like to complete planned topotecan at [facility] and have restaging scans there as planned, these prior to visit with Dr Rhodia Albright. She and husband prefer to wait to schedule any further visits at this office at least until after the scans and discussion with Dr Rhodia Albright. I have not located genetics information in [facility] records as yet. 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 10.low bone density, no DEXA information in this EMR or that I have located in [facility] records as yet. 11.post bilateral total hip replacements 12.rib fractures on CT likely related to falls at home, note right hemiparesis. Recent fall with subsequent pain right hip, better today. Known to Dr Gladstone Lighter. 13.no advance directives: discussed as above 14.likely  multifactorial anemia including chemo and CKD  Patient and husband have had questions answered to their satisfaction and are in agreement with plan above. They can contact this office for questions or concerns at any time, and I am glad to assist in her care if she  and Dr Rhodia Albright feel this would be useful. I will be in touch with Dr Rhodia Albright by this note. Time spent  55 min, including >50% discussion and coordination of care.    LIVESAY,LENNIS P, MD 12/01/2014 11:51 AM

## 2014-12-02 ENCOUNTER — Ambulatory Visit (HOSPITAL_COMMUNITY): Payer: Medicare Other

## 2014-12-02 LAB — CA 125: CA 125: 340 U/mL — ABNORMAL HIGH (ref <35)

## 2014-12-07 ENCOUNTER — Encounter (HOSPITAL_COMMUNITY): Payer: Self-pay

## 2014-12-07 ENCOUNTER — Ambulatory Visit (HOSPITAL_COMMUNITY): Payer: Medicare Other

## 2014-12-07 ENCOUNTER — Ambulatory Visit (HOSPITAL_COMMUNITY)
Admission: RE | Admit: 2014-12-07 | Discharge: 2014-12-07 | Disposition: A | Discharge status: Home / Self Care | Payer: Medicare Other | Source: Ambulatory Visit | Attending: Cardiology | Admitting: Cardiology

## 2014-12-07 VITALS — BP 116/72 | HR 75 | Wt 134.5 lb

## 2014-12-07 DIAGNOSIS — D6481 Anemia due to antineoplastic chemotherapy: Secondary | ICD-10-CM | POA: Diagnosis not present

## 2014-12-07 DIAGNOSIS — M069 Rheumatoid arthritis, unspecified: Secondary | ICD-10-CM | POA: Diagnosis not present

## 2014-12-07 DIAGNOSIS — N183 Chronic kidney disease, stage 3 unspecified: Secondary | ICD-10-CM

## 2014-12-07 DIAGNOSIS — J449 Chronic obstructive pulmonary disease, unspecified: Secondary | ICD-10-CM | POA: Diagnosis not present

## 2014-12-07 DIAGNOSIS — F329 Major depressive disorder, single episode, unspecified: Secondary | ICD-10-CM | POA: Diagnosis not present

## 2014-12-07 DIAGNOSIS — I5043 Acute on chronic combined systolic (congestive) and diastolic (congestive) heart failure: Secondary | ICD-10-CM

## 2014-12-07 DIAGNOSIS — C569 Malignant neoplasm of unspecified ovary: Secondary | ICD-10-CM | POA: Diagnosis not present

## 2014-12-07 DIAGNOSIS — N189 Chronic kidney disease, unspecified: Secondary | ICD-10-CM | POA: Insufficient documentation

## 2014-12-07 DIAGNOSIS — E785 Hyperlipidemia, unspecified: Secondary | ICD-10-CM | POA: Insufficient documentation

## 2014-12-07 DIAGNOSIS — Z8673 Personal history of transient ischemic attack (TIA), and cerebral infarction without residual deficits: Secondary | ICD-10-CM | POA: Diagnosis not present

## 2014-12-07 DIAGNOSIS — Z79899 Other long term (current) drug therapy: Secondary | ICD-10-CM | POA: Insufficient documentation

## 2014-12-07 DIAGNOSIS — I5022 Chronic systolic (congestive) heart failure: Secondary | ICD-10-CM | POA: Diagnosis present

## 2014-12-07 DIAGNOSIS — F17210 Nicotine dependence, cigarettes, uncomplicated: Secondary | ICD-10-CM | POA: Diagnosis not present

## 2014-12-07 DIAGNOSIS — I340 Nonrheumatic mitral (valve) insufficiency: Secondary | ICD-10-CM | POA: Diagnosis not present

## 2014-12-07 DIAGNOSIS — I429 Cardiomyopathy, unspecified: Secondary | ICD-10-CM | POA: Insufficient documentation

## 2014-12-07 DIAGNOSIS — Z720 Tobacco use: Secondary | ICD-10-CM

## 2014-12-07 DIAGNOSIS — F17200 Nicotine dependence, unspecified, uncomplicated: Secondary | ICD-10-CM

## 2014-12-07 DIAGNOSIS — Z7982 Long term (current) use of aspirin: Secondary | ICD-10-CM | POA: Insufficient documentation

## 2014-12-07 MED ORDER — BISOPROLOL FUMARATE 5 MG PO TABS: ORAL_TABLET | ORAL | Status: DC

## 2014-12-07 NOTE — Patient Instructions (Signed)
Increase Bisoprolol to 5 mg (1 tab) in AM and 2.5 mg (1/2 tab) in PM  Your physician has requested that you have an echocardiogram. Echocardiography is a painless test that uses sound waves to create images of your heart. It provides your doctor with information about the size and shape of your heart and how well your heart's chambers and valves are working. This procedure takes approximately one hour. There are no restrictions for this procedure.  IN [place]  Your physician recommends that you schedule a follow-up appointment in: 2 months

## 2014-12-07 NOTE — Progress Notes (Signed)
Patient ID: Anne Stevens, female   DOB: 21-Jun-1960, 54 y.o.   MRN: [id number] PCP: Dr. America Brown  54 yo with history of ovarian cancer with recurrence, CVA 12/11, COPD/smoking, CKD presented to the [facility] ER with dyspnea and was found to have CHF exacerbation in 5/16. Patient's ovarian cancer was diagnosed in 2013. She received liposomal doxorubicin from 4/15 to 12/15. Echo in 4/15 showed normal LV systolic function (EF 65-68%). She was noted to have recurrence with worsening peritoneal disease recently and received her first cycle of topotecan on 09/29/14. She had been short of breath for about 3 weeks prior to admission. She was short of breath just walking around her house. She had had orthopnea and episodes of PND. She was diuresed in the hospital and started on cardiac meds.  Echo showed EF 25-30% with diffuse hypokinesis.  There was severe central MR.    She returns for HF followup. At last visit we decreased her Lasix and scheduled for [facility] which showed no ischemia. Still depressed to be on chronic chemotherapy, but feeling better than previous visit.  Can walk around house and perform ADLs without SOB, but walking to mailbox (50 yards) still causes DOE. Weight at home has been in low 130s. Continues to smoke 1/2 ppd of cigarettes.  Denies orthopnea/PND/chest pain.  Continues to have occasional mild lightheadedness with standing.  She is getting topotecan infusions every Thursday.  Creatinine is stable. She is taking all of her medications as directed and denies missing any doses.  Labs (10/05/14): K 4.5, creatinine 2.05, BNP 1903 => 1148, hemoglobin 8.8 Labs (10/12/14) : K 5.3, creatinine 2.44, BNP 753.6 Labs (12/01/14): K 4.2, creatinine 1.8  Review of systems complete and found to be negative unless listed above in HPI  Past Medical History: 1. Ovarian cancer: Diagnosed in 2013, has recurred more recently with worsening peritoneal disease.  - Initial chemo with Taxol, carboplatin and  later Gemzar.  - She had liposomal doxorubicin from 4/15 to 12/15.  - Topotecan began with initial dose on 09/29/14.  2. Hyperlipidemia 3. 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  4. COPD: Quit smoking 5/16.  5. CKD: Creatinine ranging 1.9-2.2 over the last few months.  6. Depression 7. Rheumatoid arthritis 8. Bilateral THR 9. Anemia: Related to chemotherapy.  10. Cardiomyopathy: Echo (4/15) at [facility] with EF 55-60%. Echo (5/16) with EF 25-30%, moderately dilated LV, moderate diastolic dysfunction, normal RV size with mildly decreased systolic function, severe central MR likely due to annular dilatation.  - Lexiscan 10/27/14 showed mild intensity, moderate sized fixed inferior defect suggestive of bowel artifact, no ischemia. The left ventricular ejection fraction is moderately decreased (38%).   SH: Quit smoking in 5/16.  Married.  Occasional ETOH and marijuana.   FH: Mother with CVA and "heart disease."  Father with "heart disease."   ROS: All systems reviewed and negative except as per HPI.   Current Outpatient Prescriptions  Medication Sig Dispense Refill  . ADVAIR DISKUS 500-50 MCG/DOSE AEPB Inhale 2 puffs into the lungs 2 (two) times daily as needed.    . ARIPiprazole (ABILIFY) 2 MG tablet Take 2 mg by mouth daily before breakfast.    . aspirin 325 MG tablet Take 325 mg by mouth daily.    Marland Kitchen atorvastatin (LIPITOR) 20 MG tablet Take 1 tablet (20 mg total) by mouth daily. 30 tablet 3  . bisacodyl (DULCOLAX) 5 MG EC tablet Take 5 mg by mouth daily as needed for moderate constipation.    Marland Kitchen  bisoprolol (ZEBETA) 5 MG tablet Take 1 tab in AM and 1/2 tab in PM 45 tablet 3  . clonazePAM (KLONOPIN) 1 MG tablet Take 1 mg by mouth 3 (three) times daily as needed. anxiety    . feeding supplement, RESOURCE BREEZE, (RESOURCE BREEZE) LIQD Take 1 Container by mouth daily.    . furosemide (LASIX) 20 MG tablet Take 1 tablet (20 mg total) by mouth daily. Hold for weight less than 137 30  tablet 3  . hydrALAZINE (APRESOLINE) 25 MG tablet Take 1 tablet (25 mg total) by mouth 3 (three) times daily. 90 tablet 3  . isosorbide mononitrate (IMDUR) 30 MG 24 hr tablet Take 1 tablet (30 mg total) by mouth daily. 30 tablet 3  . Mouthwash Compounding Base (MOUTH WASH-GP PO) Take 30 mLs by mouth QID.    Marland Kitchen ondansetron (ZOFRAN) 8 MG tablet Take by mouth every 8 (eight) hours as needed for nausea or vomiting.    Marland Kitchen oxyCODONE-acetaminophen (PERCOCET) 10-325 MG per tablet Take 1 tablet by mouth every 4 (four) hours as needed. Pain 60 tablet 0  . PROAIR HFA 108 (90 BASE) MCG/ACT inhaler Inhale 2 puffs into the lungs every 4 (four) hours as needed.    . senna-docusate (SENOKOT S) 8.6-50 MG per tablet Take 1 tablet by mouth daily as needed for mild constipation.    . topiramate (TOPAMAX) 100 MG tablet Take 100 mg by mouth 2 (two) times daily.  10  . topotecan in sodium chloride 0.9 % 100 mL Inject 1.5 mg/m2 into the vein once a week.    . venlafaxine XR (EFFEXOR-XR) 150 MG 24 hr capsule Take 150 mg by mouth 2 (two) times daily.      No current facility-administered medications for this encounter.   BP 116/72 mmHg  Pulse 75  Wt 134 lb 8 oz (61.009 kg)  SpO2 100% General: NAD Neck: No JVD, no thyromegaly or thyroid nodule. No carotid bruit. Lungs: Mildly decreased breath sounds bibasilarly CV: Nondisplaced PMI.  Heart regular S1/S2, no S3/S4, 1/6 HSM heard best at apex.       Abdomen: Soft, nontender, no hepatosplenomegaly, no distention.  Skin: Intact without lesions or rashes.  Neurologic: Alert and oriented x 3.  Psych: Normal affect. Extremities: No clubbing or cyanosis. No peripheral edema. Pedal pulses 2+ HEENT: Normal.   Assessment/Plan: 1. Chronic systolic CHF: Cardiolite 09/05/71 LVEF 38%, no ischemia. Echo 5/16 showed EF 25-30%, moderately dilated LV, severe central MR likely functional from annular dilatation. Most likely etiology cardiomyopathy is doxorubicin cardiotoxicity. Echo  prior to doxorubicin initiation in 4/15 showed normal EF. NYHA class II-III symptoms currently with no volume overload.  - Continue Lasix 20 mg daily prn.  - No ACEI or spironolactone with elevated creatinine. - Continue afterload reduction with hydralazine 25 mg tid + imdur 30 mg.  - Increase bisoprolol to 5 qam/2.5 qpm  (using bisoprolol for superior beta-1 selectivity with suspected COPD).   - With evidence of peritoneal disease will not pursue ICD.   2. Mitral regurgitation: Severe central MR on echo in the hospital in 5/16. Probably functional from annular dilatation. Treat cardiomyopathy medically to hopefully improve this. May be candidate for MV clipping down the road.  3. CKD: Creatinine actually improved some when last checked, 1.8.  Nephrology consult pending.   4. Smoker: I again encouraged her to stop smoking.  5. CVA: Prior CVA. Continue Lipitor 20 mg and ASA 81. Good lipids 5/16.   6. Ovarian cancer: Continued treatment at  [place].   Follow up in 2 months.    Loralie Champagne 12/07/2014

## 2014-12-09 ENCOUNTER — Ambulatory Visit (HOSPITAL_COMMUNITY): Payer: Medicare Other

## 2014-12-09 DIAGNOSIS — D6481 Anemia due to antineoplastic chemotherapy: Secondary | ICD-10-CM | POA: Insufficient documentation

## 2014-12-09 DIAGNOSIS — Z95828 Presence of other vascular implants and grafts: Secondary | ICD-10-CM | POA: Insufficient documentation

## 2014-12-09 DIAGNOSIS — E28319 Asymptomatic premature menopause: Secondary | ICD-10-CM | POA: Insufficient documentation

## 2014-12-09 DIAGNOSIS — C562 Malignant neoplasm of left ovary: Secondary | ICD-10-CM | POA: Insufficient documentation

## 2014-12-09 DIAGNOSIS — N183 Chronic kidney disease, stage 3 unspecified: Secondary | ICD-10-CM | POA: Insufficient documentation

## 2014-12-09 DIAGNOSIS — I509 Heart failure, unspecified: Secondary | ICD-10-CM | POA: Insufficient documentation

## 2014-12-09 DIAGNOSIS — T451X5A Adverse effect of antineoplastic and immunosuppressive drugs, initial encounter: Secondary | ICD-10-CM

## 2014-12-09 DIAGNOSIS — M859 Disorder of bone density and structure, unspecified: Secondary | ICD-10-CM | POA: Insufficient documentation

## 2014-12-09 DIAGNOSIS — M069 Rheumatoid arthritis, unspecified: Secondary | ICD-10-CM | POA: Insufficient documentation

## 2014-12-09 DIAGNOSIS — I69359 Hemiplegia and hemiparesis following cerebral infarction affecting unspecified side: Secondary | ICD-10-CM | POA: Insufficient documentation

## 2014-12-09 DIAGNOSIS — M8580 Other specified disorders of bone density and structure, unspecified site: Secondary | ICD-10-CM | POA: Insufficient documentation

## 2014-12-09 DIAGNOSIS — Z720 Tobacco use: Secondary | ICD-10-CM | POA: Insufficient documentation

## 2014-12-14 ENCOUNTER — Ambulatory Visit (HOSPITAL_COMMUNITY): Payer: Medicare Other

## 2014-12-14 ENCOUNTER — Encounter (HOSPITAL_COMMUNITY): Payer: Medicare Other

## 2014-12-16 ENCOUNTER — Ambulatory Visit (HOSPITAL_COMMUNITY): Payer: Medicare Other

## 2014-12-21 ENCOUNTER — Ambulatory Visit (HOSPITAL_COMMUNITY): Payer: Medicare Other

## 2014-12-23 ENCOUNTER — Ambulatory Visit (HOSPITAL_COMMUNITY): Payer: Medicare Other

## 2014-12-28 ENCOUNTER — Ambulatory Visit (HOSPITAL_COMMUNITY): Payer: Medicare Other

## 2014-12-30 ENCOUNTER — Ambulatory Visit (HOSPITAL_COMMUNITY): Payer: Medicare Other

## 2015-01-04 ENCOUNTER — Ambulatory Visit (HOSPITAL_COMMUNITY): Payer: Medicare Other

## 2015-01-06 ENCOUNTER — Ambulatory Visit (HOSPITAL_COMMUNITY): Payer: Medicare Other

## 2015-01-11 ENCOUNTER — Ambulatory Visit (HOSPITAL_COMMUNITY): Payer: Medicare Other

## 2015-01-13 ENCOUNTER — Ambulatory Visit (HOSPITAL_COMMUNITY): Payer: Medicare Other

## 2015-01-18 ENCOUNTER — Ambulatory Visit (HOSPITAL_COMMUNITY): Payer: Medicare Other

## 2015-01-20 ENCOUNTER — Ambulatory Visit (HOSPITAL_COMMUNITY): Payer: Medicare Other

## 2015-01-25 ENCOUNTER — Ambulatory Visit (HOSPITAL_COMMUNITY): Payer: Medicare Other

## 2015-01-26 ENCOUNTER — Encounter (HOSPITAL_COMMUNITY): Payer: Self-pay | Admitting: Emergency Medicine

## 2015-01-26 ENCOUNTER — Emergency Department (HOSPITAL_COMMUNITY): Payer: Medicare Other

## 2015-01-26 ENCOUNTER — Emergency Department (HOSPITAL_COMMUNITY)
Admission: EM | Admit: 2015-01-26 | Discharge: 2015-01-27 | Disposition: A | Discharge status: Home / Self Care | Payer: Medicare Other | Attending: Emergency Medicine | Admitting: Emergency Medicine

## 2015-01-26 DIAGNOSIS — T84020A Dislocation of internal right hip prosthesis, initial encounter: Secondary | ICD-10-CM | POA: Diagnosis not present

## 2015-01-26 DIAGNOSIS — R011 Cardiac murmur, unspecified: Secondary | ICD-10-CM | POA: Insufficient documentation

## 2015-01-26 DIAGNOSIS — Z7982 Long term (current) use of aspirin: Secondary | ICD-10-CM | POA: Insufficient documentation

## 2015-01-26 DIAGNOSIS — Z8673 Personal history of transient ischemic attack (TIA), and cerebral infarction without residual deficits: Secondary | ICD-10-CM | POA: Insufficient documentation

## 2015-01-26 DIAGNOSIS — S73004A Unspecified dislocation of right hip, initial encounter: Secondary | ICD-10-CM

## 2015-01-26 DIAGNOSIS — M25561 Pain in right knee: Secondary | ICD-10-CM | POA: Diagnosis not present

## 2015-01-26 DIAGNOSIS — F329 Major depressive disorder, single episode, unspecified: Secondary | ICD-10-CM | POA: Insufficient documentation

## 2015-01-26 DIAGNOSIS — G43909 Migraine, unspecified, not intractable, without status migrainosus: Secondary | ICD-10-CM | POA: Diagnosis not present

## 2015-01-26 DIAGNOSIS — R52 Pain, unspecified: Secondary | ICD-10-CM

## 2015-01-26 DIAGNOSIS — Z8543 Personal history of malignant neoplasm of ovary: Secondary | ICD-10-CM | POA: Diagnosis not present

## 2015-01-26 DIAGNOSIS — M79651 Pain in right thigh: Secondary | ICD-10-CM | POA: Diagnosis not present

## 2015-01-26 DIAGNOSIS — I509 Heart failure, unspecified: Secondary | ICD-10-CM | POA: Diagnosis not present

## 2015-01-26 DIAGNOSIS — Z96641 Presence of right artificial hip joint: Secondary | ICD-10-CM | POA: Insufficient documentation

## 2015-01-26 DIAGNOSIS — J449 Chronic obstructive pulmonary disease, unspecified: Secondary | ICD-10-CM | POA: Diagnosis not present

## 2015-01-26 DIAGNOSIS — F419 Anxiety disorder, unspecified: Secondary | ICD-10-CM | POA: Diagnosis not present

## 2015-01-26 DIAGNOSIS — Y831 Surgical operation with implant of artificial internal device as the cause of abnormal reaction of the patient, or of later complication, without mention of misadventure at the time of the procedure: Secondary | ICD-10-CM | POA: Diagnosis not present

## 2015-01-26 DIAGNOSIS — Z720 Tobacco use: Secondary | ICD-10-CM | POA: Insufficient documentation

## 2015-01-26 DIAGNOSIS — Z79899 Other long term (current) drug therapy: Secondary | ICD-10-CM | POA: Diagnosis not present

## 2015-01-26 DIAGNOSIS — M25551 Pain in right hip: Secondary | ICD-10-CM | POA: Diagnosis present

## 2015-01-26 DIAGNOSIS — M069 Rheumatoid arthritis, unspecified: Secondary | ICD-10-CM | POA: Diagnosis not present

## 2015-01-26 DIAGNOSIS — E785 Hyperlipidemia, unspecified: Secondary | ICD-10-CM | POA: Insufficient documentation

## 2015-01-26 MED ORDER — HYDROMORPHONE HCL 1 MG/ML IJ SOLN
1.0000 mg | Freq: Once | INTRAMUSCULAR | Status: AC
  Administered 2015-01-26: 1 mg via INTRAVENOUS
  Filled 2015-01-26: qty 1

## 2015-01-26 MED ORDER — PROPOFOL 10 MG/ML IV BOLUS
100.0000 mg | Freq: Once | INTRAVENOUS | Status: AC
  Administered 2015-01-26: 40 mg via INTRAVENOUS
  Filled 2015-01-26: qty 20

## 2015-01-26 NOTE — ED Notes (Signed)
Patient not in room yet 

## 2015-01-26 NOTE — Discharge Instructions (Signed)
Use crutches as needed when walking for stability. You may put weight on your right leg as you tolerate it. Do not perform any heavy lifting for the next 1-2 weeks. Follow up with your orthopedist for persist pain or worsening symptoms. Return to the ED as needed if symptoms worsen.  Closed Reduction for Artificial Hip Dislocation The dislocation of an artificial hip is when the ball of the hip has slipped out of its socket. Many of these dislocations can be treated by simply putting the ball back in the socket (closed reduction). This can often be done with a set of mechanical parts that move bones (traction) and moving the leg around (manipulation). When the artificial hip is put back into place with manipulation, a brace or cast may be needed. Your caregiver will decide if a brace or cast is necessary. Your caregiver will decide the amount of time required for this. A brace or cast will help you heal completely so the hip will be less likely to dislocate again. When the hip cannot be relocated with manipulation, surgery may be needed.  LET YOUR CAREGIVER KNOW ABOUT:  Allergies to food or medicine.  Medicines taken, including vitamins, herbs, eyedrops, over-the-counter medicines, and creams.  Use of steroids (by mouth or creams).  Previous problems with anesthetics or numbing medicines.  History of bleeding problems or blood clots.  Previous surgery.  Other health problems, including diabetes and kidney problems.  Possibility of pregnancy, if this applies. BEFORE THE PROCEDURE Before your hip is put back in place, an intravenous (IV) access may be started. You may be given an anesthetic in your back to make you numb from the waist down (spinal anesthetic).  AFTER THE PROCEDURE You will be taken to the recovery area. A nurse will monitor your progress. You will be returned to your room when you are awake and without problems. You will receive physical therapy until you are moving around by  yourself and your caregiver feels it is safe for you to be released. MAKE SURE YOU:   Understand these instructions.  Will watch your condition.  Will get help right away if you are not doing well or get worse. Document Released: 02/07/2001 Document Revised: 08/07/2011 Document Reviewed: 05/18/2008 [facility] Patient Information 2015 [place], [place]. This information is not intended to replace advice given to you by your health care provider. Make sure you discuss any questions you have with your health care provider.  Closed Reduction for Artificial Hip Dislocation, Care After Please read the instructions outlined below. Refer to these instructions for the next few weeks. These discharge instructions provide you with general information on caring for yourself after your closed reduction. Your caregiver may also give you specific instructions. If you have any problems or questions after discharge, please call your caregiver. HOME CARE INSTRUCTIONS   You may resume a normal diet and activities as directed or allowed.  Only take over-the-counter or prescription medicines for pain, discomfort, or fever as directed by your caregiver.  Use crutches as instructed. When you are allowed full weight bearing, begin slowly. Reduce the activity if it is causing pain or discomfort.  If you have been given a brace, wear it as instructed.  Apply ice to the injured hip for 15-20 minutes, 03-04 times per day while awake for 2 days. Put the ice in a plastic bag and place a thin towel between the bag of ice and your cast.  Do range of motion exercises only as instructed by your  caregiver. SEEK MEDICAL CARE IF:   You have swelling or tenderness of your calf or leg.  You develop shortness of breath.  You have increasing pain or discomfort in your hip which is not relieved with medicine.  You notice increasing swelling around the hip.  You have any numbness or tingling in your hip or leg.  You are not  improving or are getting worse.  You have any other questions or concerns. SEEK IMMEDIATE MEDICAL CARE IF:   Your hip re-dislocates.  You develop a rash.  You have difficulty breathing.  You develop chest pain.  You develop any reaction or side effects to medicines given. MAKE SURE YOU:   Understand these instructions.  Will watch your condition.  Will get help right away if you are not doing well or get worse. Document Released: 12/02/2004 Document Revised: 03/05/2013 Document Reviewed: 05/15/2005 [facility] Patient Information 2015 [place], [place]. This information is not intended to replace advice given to you by your health care provider. Make sure you discuss any questions you have with your health care provider.

## 2015-01-26 NOTE — ED Notes (Signed)
20mg  of propofol given by dr. Darl Householder

## 2015-01-26 NOTE — ED Notes (Signed)
[facility] CALLED @[phone] .

## 2015-01-26 NOTE — Sedation Documentation (Signed)
40mg  of propofol given.

## 2015-01-26 NOTE — ED Notes (Signed)
Dr. Darl Householder at the bedside to update patient.

## 2015-01-26 NOTE — Sedation Documentation (Signed)
Hip reset. Ortho tech present and placed knee immobilizer.

## 2015-01-26 NOTE — ED Notes (Signed)
Pt sts right knee pain into hip; pt sts feels like not working well due to pain

## 2015-01-26 NOTE — ED Notes (Signed)
Portable xray at the bedside. Husband leaves address for [facility] transport: [address], [place], [place] [address]

## 2015-01-26 NOTE — ED Notes (Signed)
30mg  propofol given by Dr. Darl Householder.

## 2015-01-26 NOTE — ED Notes (Signed)
30mg  of propofol given by dr. Darl Householder

## 2015-01-26 NOTE — Progress Notes (Signed)
Orthopedic Tech Progress Note Patient Details:  Anne Stevens 1960/10/23 [id number]  Ortho Devices Type of Ortho Device: Knee Immobilizer Ortho Device/Splint Location: RLE Ortho Device/Splint Interventions: Ordered, Application   Braulio Bosch 01/26/2015, 10:49 PM

## 2015-01-26 NOTE — ED Provider Notes (Signed)
CSN: [id number]     Arrival date & time 01/26/15  1648 History   First MD Initiated Contact with Patient 01/26/15 1958     Chief Complaint  Patient presents with  . Knee Pain     (Consider location/radiation/quality/duration/timing/severity/associated sxs/prior Treatment) HPI Comments: 54 year old female with a history of rheumatoid arthritis, congestive heart failure, ovarian cancer (on chemotherapy infusions until 1.5 months ago), neuropathy secondary to chemotherapy, COPD, and CVA in 2011 presents to the ED for evaluation of right hip and knee pain. Patient reports a hx of b/l hip replacement done by Dr. Gladstone Lighter. She states that she was walking 2 days ago when her R knee "gave out". Patient has been taking her oxycodone for symptoms without significant relief. She states that she has been unable to put any weight on her R leg since her knee gave out. Patient has started feeling pain into her R hip. Pain worse with weight bearing. No new or worsening numbness in the RLE, per patient. She states that her knee has "gave out before", but she has always been able to "get it back in".   Patient is a 54 y.o. female presenting with knee pain. The history is provided by the patient. No language interpreter was used.  Knee Pain   Past Medical History  Diagnosis Date  . Anxiety   . Asthma   . Neuromuscular disorder     neuropathy secondary to chemo   . Anemia     hx of   . Rheumatoid arthritis   . Hyperlipidemia   . CHF (congestive heart failure) dx'd 09/30/2014  . Heart murmur     "thought I grew out of it but dr. heard it today" (09/30/2014)  . COPD (chronic obstructive pulmonary disease)   . History of blood transfusion "several times"  . Migraine     "stopped ~ 2012" (09/30/2014)  . Stroke 04/2010    right arm and leg numbness initially; right arm still numb" (09/30/2014)  . Depression   . Ovarian cancer     1975; 2011; 04/2012 ; Chemotherapy 05/2012 through December 2015 (09/30/2014)  .  Chronic bronchitis     "I've had it several times but didn't get it this year" (09/30/2014)   Past Surgical History  Procedure Laterality Date  . Colonoscopy    . Radial head arthroplasty Right 1980's    "for my RA"  . Joint replacement    . Total hip arthroplasty  02/05/2012    Procedure: TOTAL HIP ARTHROPLASTY;  Surgeon: Tobi Bastos, MD;  Location: [facility];  Service: Orthopedics;  Laterality: Left;  . Right oophorectomy Right 1975  . Abdominal hysterectomy  2011  . Total hip arthroplasty Bilateral 01/2011-01/2012   Family History  Problem Relation Age of Onset  . Stroke Mother   . High blood pressure Mother   . High Cholesterol Mother   . Heart Problems Mother   . Rheum arthritis Mother   . Lung cancer Father   . Heart Problems Father   . Diabetes Father    Social History  Substance Use Topics  . Smoking status: Current Some Day Smoker -- 1.00 packs/day for 40 years    Types: Cigarettes, E-cigarettes    Last Attempt to Quit: 09/28/2014  . Smokeless tobacco: Never Used     Comment: Pt using nicotine patch intermittently   . Alcohol Use: Yes     Comment: rare   OB History    No data available  Review of Systems  Genitourinary:       No bowel/bladder incontinence  Musculoskeletal: Positive for myalgias and arthralgias.  Neurological: Negative for weakness and numbness.  All other systems reviewed and are negative.   Allergies  Review of patient's allergies indicates no known allergies.  Home Medications   Prior to Admission medications   Medication Sig Start Date End Date Taking? Authorizing Provider  ADVAIR DISKUS 500-50 MCG/DOSE AEPB Inhale 2 puffs into the lungs 2 (two) times daily as needed (shortness of breath).  09/28/14  Yes Historical Provider, MD  ARIPiprazole (ABILIFY) 10 MG tablet Take 5 mg by mouth daily.   Yes Historical Provider, MD  aspirin 325 MG tablet Take 325 mg by mouth at bedtime.    Yes Historical Provider, MD  atorvastatin (LIPITOR) 20  MG tablet Take 1 tablet (20 mg total) by mouth daily. 11/04/14  Yes Jolaine Artist, MD  bisacodyl (DULCOLAX) 5 MG EC tablet Take 5 mg by mouth daily as needed for moderate constipation.   Yes Historical Provider, MD  bisoprolol (ZEBETA) 5 MG tablet Take 1 tab in AM and 1/2 tab in PM 12/07/14  Yes Larey Dresser, MD  clonazePAM (KLONOPIN) 1 MG tablet Take 1 mg by mouth 3 (three) times daily. anxiety   Yes Historical Provider, MD  furosemide (LASIX) 20 MG tablet Take 1 tablet (20 mg total) by mouth daily. Hold for weight less than 137 Patient taking differently: Take 20 mg by mouth daily as needed for fluid. Hold for weight less than 137 11/04/14  Yes Jolaine Artist, MD  hydrALAZINE (APRESOLINE) 25 MG tablet Take 1 tablet (25 mg total) by mouth 3 (three) times daily. 11/04/14  Yes Jolaine Artist, MD  isosorbide mononitrate (IMDUR) 30 MG 24 hr tablet Take 1 tablet (30 mg total) by mouth daily. 11/04/14  Yes Jolaine Artist, MD  Mouthwash Compounding Base (MOUTH WASH-GP PO) Take 30 mLs by mouth 4 (four) times daily as needed (mouth sores). Magic Mouthwash 11/26/14  Yes Historical Provider, MD  ondansetron (ZOFRAN) 8 MG tablet Take by mouth every 8 (eight) hours as needed for nausea or vomiting.   Yes Historical Provider, MD  oxyCODONE-acetaminophen (PERCOCET) 10-325 MG per tablet Take 1 tablet by mouth every 4 (four) hours as needed. Pain 02/08/12  Yes Amber Cecilio Asper, PA-C  predniSONE (STERAPRED UNI-PAK 21 TAB) 5 MG (21) TBPK tablet Take by mouth as directed.  01/25/15 02/06/15 Yes Historical Provider, MD  PROAIR HFA 108 (90 BASE) MCG/ACT inhaler Inhale 2 puffs into the lungs every 4 (four) hours as needed for wheezing or shortness of breath.  09/28/14  Yes Historical Provider, MD  topiramate (TOPAMAX) 100 MG tablet Take 100 mg by mouth 2 (two) times daily. 10/19/14  Yes Historical Provider, MD  topotecan in sodium chloride 0.9 % 100 mL Inject 1.5 mg/m2 into the vein once a week.   Yes Historical  Provider, MD  venlafaxine XR (EFFEXOR-XR) 150 MG 24 hr capsule Take 150 mg by mouth 2 (two) times daily.    Yes Historical Provider, MD  feeding supplement, RESOURCE BREEZE, (RESOURCE BREEZE) LIQD Take 1 Container by mouth daily. Patient not taking: Reported on 01/26/2015 10/03/14   Modena Jansky, MD   BP 145/91 mmHg  Pulse 67  Temp(Src) 98.1 F (36.7 C) (Oral)  Resp 18  SpO2 100%   Physical Exam  Constitutional: She is oriented to person, place, and time. She appears well-developed and well-nourished. No distress.  Nontoxic/nonseptic appearing  HENT:  Head: Normocephalic and atraumatic.  Eyes: Conjunctivae and EOM are normal. No scleral icterus.  Neck: Normal range of motion.  Cardiovascular: Normal rate, regular rhythm and intact distal pulses.   DP and PT pulses 2+ in the RLE  Pulmonary/Chest: Effort normal and breath sounds normal. No respiratory distress. She has no wheezes.  Respirations even and unlabored  Musculoskeletal:       Right hip: She exhibits decreased range of motion and tenderness. She exhibits no bony tenderness and no swelling.       Right knee: She exhibits decreased range of motion (Mild decreased PROM). She exhibits no swelling, no effusion and no erythema. Tenderness (diffuse) found.       Right ankle: Normal.       Right upper leg: She exhibits tenderness. She exhibits no bony tenderness, no swelling, no edema and no deformity.       Right lower leg: Normal.  Suspect shortening of the RLE  Neurological: She is alert and oriented to person, place, and time. She exhibits normal muscle tone. Coordination normal.  Sensation to light touch intact and at baseline, per patient. Patient able to wiggle all toes.  Skin: Skin is warm and dry. No rash noted. She is not diaphoretic. No erythema. No pallor.  Psychiatric: She has a normal mood and affect. Her behavior is normal.  Nursing note and vitals reviewed.   ED Course  Procedures (including critical care  time) Labs Review Labs Reviewed - No data to display  Imaging Review Dg Pelvis 1-2 Views  01/26/2015   CLINICAL DATA:  Status post fall 2 days ago, feeling a pop in her right hip and femur.  EXAM: PELVIS - 1-2 VIEW  COMPARISON:  CT abdomen pelvis January 14, 2010  FINDINGS: Total bilateral hip replacements are noted. The left hip replacement is normal. There is superior dislocation of the femoral component of the right hip replacement. Focal sclerosis is identified in the superior right iliac wing unchanged compared to prior CT.  IMPRESSION: Superior dislocation of the femoral component of right hip replacement.   Electronically Signed   By: Abelardo Diesel M.D.   On: 01/26/2015 21:09   Dg Knee 1-2 Views Right  01/26/2015   CLINICAL DATA:  Patient with history of rheumatoid arthritis. Status post fall 2 days prior. Unable to ambulate. Initial encounter.  EXAM: RIGHT KNEE - 1-2 VIEW  COMPARISON:  Knee radiograph 04/15/2011  FINDINGS: Normal anatomic alignment. No evidence for acute displaced fracture. Lateral view is limited however there is no definite large joint effusion.  IMPRESSION: No evidence for acute displaced fracture. Limited lateral view demonstrates no large effusion. When patient clinically able consider dedicated multi view radiographs.   Electronically Signed   By: Lovey Newcomer M.D.   On: 01/26/2015 21:11   Dg Pelvis Portable  01/26/2015   CLINICAL DATA:  Right hip dislocation  EXAM: PORTABLE PELVIS 1-2 VIEWS  COMPARISON:  01/26/2015 at 20:26  FINDINGS: A single portable view of the pelvis confirms successful reduction of the right total hip arthroplasty dislocation. No fracture is evident.  IMPRESSION: Successful reduction.  Negative for fracture.   Electronically Signed   By: Andreas Newport M.D.   On: 01/26/2015 23:16   Dg Femur, Min 2 Views Right  01/26/2015   CLINICAL DATA:  Unable to bear weight for 2 days after feeling a pop in her right hip.  EXAM: RIGHT FEMUR 2 VIEWS  COMPARISON:   None.  FINDINGS: There is superior  dislocation of the right total hip arthroplasty. No fracture is evident.  IMPRESSION: Right THA dislocation   Electronically Signed   By: Andreas Newport M.D.   On: 01/26/2015 21:13     I have personally reviewed and evaluated these images and lab results as part of my medical decision-making.   EKG Interpretation None      2145 - Case discussed with Dr. Tonita Cong of [facility] who recommends attempted reduction and re-consultation if this is unsuccessful. Will prep patient for conscious sedation.  2335 - Patient rechecked. She is no longer drowsy; mentating and acting per baseline, prior to sedation. MDM   Final diagnoses:  Dislocation of right hip, initial encounter    Patient with hx of b/l THA by Dr. Gladstone Lighter presents for pain after her "knee gave out" 2 days ago. Leg shortening noted on exam. Patient neurovascularly intact. Initial xrays show dislocation of right THA.  Patient with successful reduction of right THA dislocation under conscious sedation with propofol. Patient placed in knee immobilizer and given crutches. She is stable for discharge with instruction to f/u with her orthopedist for further evaluation of her symptoms. Return precautions given at discharge. Patient discharged in good condition with no unaddressed concerns.   Filed Vitals:   01/26/15 2245 01/26/15 2248 01/26/15 2300 01/26/15 2315  BP: 123/83 123/83 142/81 144/90  Pulse: 88 83 79 77  Temp:      TempSrc:      Resp: 18 14 15 12   SpO2: 100% 100% 100% 100%     Antonietta Breach, PA-C 01/26/15 2338  Wandra Arthurs, MD 01/27/15 1459

## 2015-01-26 NOTE — ED Notes (Signed)
Claiborne Billings, PA-C, at the bedside to update patient.

## 2015-01-27 ENCOUNTER — Ambulatory Visit (HOSPITAL_COMMUNITY): Payer: Medicare Other

## 2015-01-28 ENCOUNTER — Ambulatory Visit (HOSPITAL_COMMUNITY): Payer: Medicare Other

## 2015-02-02 ENCOUNTER — Telehealth (HOSPITAL_COMMUNITY): Payer: Self-pay | Admitting: Cardiac Rehabilitation

## 2015-02-02 ENCOUNTER — Ambulatory Visit (HOSPITAL_COMMUNITY): Payer: Medicare Other

## 2015-02-02 NOTE — Telephone Encounter (Signed)
Phone message from pt cancelling today's orientation appt due to dislocated hip.  Pt reports she was treated and released in ED, has been seen by her orthopaedic with next f/u in 4 weeks. Pt has chemo as scheduled this week.  Unfortunately, this was the patient's third and final time to reschedule cardiac rehab orientation at this time.  Pt advised once hip heals and activity restrictions lifted she can obtain new referral from Dr. Aundra Dubin to pursue cardiac rehab at that time.  Understanding verbalized.

## 2015-02-03 ENCOUNTER — Ambulatory Visit (HOSPITAL_COMMUNITY): Payer: Medicare Other

## 2015-02-05 ENCOUNTER — Ambulatory Visit (HOSPITAL_COMMUNITY): Payer: Medicare Other

## 2015-02-08 ENCOUNTER — Ambulatory Visit (HOSPITAL_COMMUNITY): Payer: Medicare Other

## 2015-02-10 ENCOUNTER — Ambulatory Visit (HOSPITAL_COMMUNITY): Payer: Medicare Other

## 2015-02-12 ENCOUNTER — Ambulatory Visit (HOSPITAL_COMMUNITY): Payer: Medicare Other

## 2015-02-15 ENCOUNTER — Ambulatory Visit (HOSPITAL_COMMUNITY): Payer: Medicare Other

## 2015-02-17 ENCOUNTER — Ambulatory Visit (HOSPITAL_COMMUNITY): Payer: Medicare Other

## 2015-02-18 ENCOUNTER — Encounter (HOSPITAL_COMMUNITY): Payer: Medicare Other

## 2015-02-19 ENCOUNTER — Ambulatory Visit (HOSPITAL_COMMUNITY): Payer: Medicare Other

## 2015-02-22 ENCOUNTER — Ambulatory Visit (HOSPITAL_COMMUNITY): Payer: Medicare Other

## 2015-02-23 ENCOUNTER — Encounter (HOSPITAL_COMMUNITY): Payer: Medicare Other

## 2015-02-24 ENCOUNTER — Ambulatory Visit (HOSPITAL_COMMUNITY): Payer: Medicare Other

## 2015-02-26 ENCOUNTER — Ambulatory Visit (HOSPITAL_COMMUNITY): Payer: Medicare Other

## 2015-03-01 ENCOUNTER — Ambulatory Visit (HOSPITAL_COMMUNITY): Payer: Medicare Other

## 2015-03-03 ENCOUNTER — Ambulatory Visit (HOSPITAL_COMMUNITY): Payer: Medicare Other

## 2015-03-03 ENCOUNTER — Other Ambulatory Visit (HOSPITAL_COMMUNITY): Payer: Self-pay | Admitting: Internal Medicine

## 2015-03-05 ENCOUNTER — Ambulatory Visit (HOSPITAL_COMMUNITY): Payer: Medicare Other

## 2015-03-08 ENCOUNTER — Encounter (HOSPITAL_COMMUNITY): Payer: Self-pay

## 2015-03-08 ENCOUNTER — Ambulatory Visit (HOSPITAL_COMMUNITY): Payer: Medicare Other

## 2015-03-08 ENCOUNTER — Ambulatory Visit (HOSPITAL_COMMUNITY)
Admission: RE | Admit: 2015-03-08 | Discharge: 2015-03-08 | Disposition: A | Discharge status: Home / Self Care | Payer: Medicare Other | Source: Ambulatory Visit | Attending: Cardiology | Admitting: Cardiology

## 2015-03-08 VITALS — BP 132/80 | HR 80 | Wt 130.2 lb

## 2015-03-08 DIAGNOSIS — C569 Malignant neoplasm of unspecified ovary: Secondary | ICD-10-CM | POA: Insufficient documentation

## 2015-03-08 DIAGNOSIS — Z8673 Personal history of transient ischemic attack (TIA), and cerebral infarction without residual deficits: Secondary | ICD-10-CM | POA: Insufficient documentation

## 2015-03-08 DIAGNOSIS — N183 Chronic kidney disease, stage 3 unspecified: Secondary | ICD-10-CM

## 2015-03-08 DIAGNOSIS — I5022 Chronic systolic (congestive) heart failure: Secondary | ICD-10-CM | POA: Diagnosis not present

## 2015-03-08 DIAGNOSIS — Z720 Tobacco use: Secondary | ICD-10-CM

## 2015-03-08 DIAGNOSIS — I5043 Acute on chronic combined systolic (congestive) and diastolic (congestive) heart failure: Secondary | ICD-10-CM | POA: Diagnosis not present

## 2015-03-08 DIAGNOSIS — J449 Chronic obstructive pulmonary disease, unspecified: Secondary | ICD-10-CM | POA: Insufficient documentation

## 2015-03-08 DIAGNOSIS — F329 Major depressive disorder, single episode, unspecified: Secondary | ICD-10-CM | POA: Diagnosis not present

## 2015-03-08 DIAGNOSIS — Z79899 Other long term (current) drug therapy: Secondary | ICD-10-CM | POA: Insufficient documentation

## 2015-03-08 DIAGNOSIS — I429 Cardiomyopathy, unspecified: Secondary | ICD-10-CM | POA: Diagnosis not present

## 2015-03-08 DIAGNOSIS — I5021 Acute systolic (congestive) heart failure: Secondary | ICD-10-CM

## 2015-03-08 DIAGNOSIS — Z87891 Personal history of nicotine dependence: Secondary | ICD-10-CM | POA: Insufficient documentation

## 2015-03-08 DIAGNOSIS — N189 Chronic kidney disease, unspecified: Secondary | ICD-10-CM | POA: Diagnosis not present

## 2015-03-08 DIAGNOSIS — E785 Hyperlipidemia, unspecified: Secondary | ICD-10-CM | POA: Insufficient documentation

## 2015-03-08 DIAGNOSIS — I340 Nonrheumatic mitral (valve) insufficiency: Secondary | ICD-10-CM | POA: Diagnosis not present

## 2015-03-08 DIAGNOSIS — M069 Rheumatoid arthritis, unspecified: Secondary | ICD-10-CM | POA: Diagnosis not present

## 2015-03-08 DIAGNOSIS — Z9221 Personal history of antineoplastic chemotherapy: Secondary | ICD-10-CM | POA: Diagnosis not present

## 2015-03-08 DIAGNOSIS — Z7982 Long term (current) use of aspirin: Secondary | ICD-10-CM | POA: Insufficient documentation

## 2015-03-08 LAB — BASIC METABOLIC PANEL
ANION GAP: 9 (ref 5–15)
BUN: 32 mg/dL — AB (ref 6–20)
CHLORIDE: 110 mmol/L (ref 101–111)
CO2: 18 mmol/L — ABNORMAL LOW (ref 22–32)
Calcium: 8.8 mg/dL — ABNORMAL LOW (ref 8.9–10.3)
Creatinine, Ser: 1.73 mg/dL — ABNORMAL HIGH (ref 0.44–1.00)
GFR calc Af Amer: 37 mL/min — ABNORMAL LOW (ref >60)
GFR calc non Af Amer: 32 mL/min — ABNORMAL LOW (ref >60)
GLUCOSE: 105 mg/dL — AB (ref 65–99)
POTASSIUM: 4.6 mmol/L (ref 3.5–5.1)
SODIUM: 137 mmol/L (ref 135–145)

## 2015-03-08 MED ORDER — ASPIRIN 81 MG PO TABS: 81.0000 mg | ORAL_TABLET | Freq: Every day | ORAL | Status: AC

## 2015-03-08 MED ORDER — BISOPROLOL FUMARATE 5 MG PO TABS: 5.0000 mg | ORAL_TABLET | Freq: Two times a day (BID) | ORAL | Status: DC

## 2015-03-08 NOTE — Progress Notes (Signed)
Advanced Heart Failure clinic Note   Patient ID: Anne Stevens, female   DOB: 1960-08-26, 54 y.o.   MRN: [id number] PCP: Dr. America Brown  54 yo with history of ovarian cancer with recurrence, CVA 12/11, COPD/smoking, CKD presented to the [facility] ER with dyspnea and was found to have CHF exacerbation in 5/16. Patient's ovarian cancer was diagnosed in 2013. She received liposomal doxorubicin from 4/15 to 12/15. Echo in 4/15 showed normal LV systolic function (EF 40-98%). She was noted to have recurrence with worsening peritoneal disease recently and received her first cycle of topotecan on 09/29/14. She had been short of breath for about 3 weeks prior to admission. She was short of breath just walking around her house. She had had orthopnea and episodes of PND. She was diuresed in the hospital and started on cardiac meds.  Echo showed EF 25-30% with diffuse hypokinesis.  There was severe central MR.    She returns for HF followup. At last visit we increased her bisoprolol, and she has tolerated well. Mood has improved, has accepted need for chronic chemotherapy. Still can't do much around the house, but she has picked up further on her smoking to 1 ppd which seems to have affected it. Walking to mailbox (50 yards) still causes DOE. Weight at home remains in 130s. Denies orthopnea/PND/chest pain. Only occasional lightheadedness with standing too quickly, but this has improved. She is getting topotecan infusions every Thursday. She is taking all of her medications as directed other than not increasing bisoprolol.  Saw nephrology, watchful waiting current plan.  Labs (10/05/14): K 4.5, creatinine 2.05, BNP 1903 => 1148, hemoglobin 8.8 Labs (10/12/14) : K 5.3, creatinine 2.44, BNP 753.6 Labs (12/01/14): K 4.2, creatinine 1.8  Review of systems complete and found to be negative unless listed above in HPI  Past Medical History: 1. Ovarian cancer: Diagnosed in 2013, has recurred more recently with worsening  peritoneal disease.  - Initial chemo with Taxol, carboplatin and later Gemzar.  - She had liposomal doxorubicin from 4/15 to 12/15.  - Topotecan began with initial dose on 09/29/14.  2. Hyperlipidemia 3. 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  4. COPD: Quit smoking 5/16.  5. CKD: Creatinine ranging 1.9-2.2 over the last few months.  6. Depression 7. Rheumatoid arthritis 8. Bilateral THR 9. Anemia: Related to chemotherapy.  10. Cardiomyopathy: Echo (4/15) at [facility] with EF 55-60%. Echo (5/16) with EF 25-30%, moderately dilated LV, moderate diastolic dysfunction, normal RV size with mildly decreased systolic function, severe central MR likely due to annular dilatation.  - Lexiscan 10/27/14 showed mild intensity, moderate sized fixed inferior defect suggestive of bowel artifact, no ischemia. The left ventricular ejection fraction is moderately decreased (38%).   SH: Quit smoking in 5/16.  Married.  Occasional ETOH and marijuana.   FH: Mother with CVA and "heart disease."  Father with "heart disease."   ROS: All systems reviewed and negative except as per HPI.   Current Outpatient Prescriptions  Medication Sig Dispense Refill  . ARIPiprazole (ABILIFY) 10 MG tablet Take 5 mg by mouth daily.    Marland Kitchen aspirin 325 MG tablet Take 325 mg by mouth at bedtime.     Marland Kitchen atorvastatin (LIPITOR) 20 MG tablet Take 1 tablet (20 mg total) by mouth daily. 30 tablet 3  . bisacodyl (DULCOLAX) 5 MG EC tablet Take 5 mg by mouth daily as needed for moderate constipation.    . bisoprolol (ZEBETA) 5 MG tablet Take 1 tab in AM and  1/2 tab in PM 45 tablet 3  . clonazePAM (KLONOPIN) 1 MG tablet Take 1 mg by mouth 3 (three) times daily. anxiety    . feeding supplement, RESOURCE BREEZE, (RESOURCE BREEZE) LIQD Take 1 Container by mouth daily.    . hydrALAZINE (APRESOLINE) 25 MG tablet Take 1 tablet (25 mg total) by mouth 3 (three) times daily. 90 tablet 3  . isosorbide mononitrate (IMDUR) 30 MG 24 hr tablet  TAKE 1 TABLET (30 MG TOTAL) BY MOUTH DAILY. 30 tablet 3  . ondansetron (ZOFRAN) 8 MG tablet Take by mouth every 8 (eight) hours as needed for nausea or vomiting.    Marland Kitchen oxyCODONE-acetaminophen (PERCOCET) 10-325 MG per tablet Take 1 tablet by mouth every 4 (four) hours as needed. Pain 60 tablet 0  . PROAIR HFA 108 (90 BASE) MCG/ACT inhaler Inhale 2 puffs into the lungs every 4 (four) hours as needed for wheezing or shortness of breath.     . topiramate (TOPAMAX) 100 MG tablet Take 100 mg by mouth 2 (two) times daily.  10  . topotecan in sodium chloride 0.9 % 100 mL Inject 1.5 mg/m2 into the vein once a week.    . venlafaxine XR (EFFEXOR-XR) 150 MG 24 hr capsule Take 150 mg by mouth 2 (two) times daily.     Marland Kitchen ADVAIR DISKUS 500-50 MCG/DOSE AEPB Inhale 2 puffs into the lungs 2 (two) times daily as needed (shortness of breath).     . furosemide (LASIX) 20 MG tablet TAKE 1 TABLET (20 MG TOTAL) BY MOUTH DAILY. HOLD FOR WEIGHT LESS THAN 137 (Patient not taking: Reported on 03/08/2015) 30 tablet 3  . Mouthwash Compounding Base (MOUTH WASH-GP PO) Take 30 mLs by mouth 4 (four) times daily as needed (mouth sores). Magic Mouthwash     No current facility-administered medications for this encounter.   BP 132/80 mmHg  Pulse 80  Wt 130 lb 4 oz (59.081 kg)  SpO2 100%   General: NAD, smells strongly of cigarettes, thin HEENT: Normal.  Neck: No JVD, no thyromegaly or thyroid nodule noted. No carotid bruit. Lungs: Mildly decreased throughout CV: Nondisplaced PMI.  Heart regular S1/S2, no S3/S4, 1/6 HSM heard best at apex.    Abdomen: Soft, NT, ND, no HSM.  Skin: Intact without lesions or rashes. Neurologic: Alert and oriented x 3.  Psych: Normal affect. Extremities: No clubbing or cyanosis. No peripheral edema. Pedal pulses 2+   Assessment/Plan: 1. Chronic systolic CHF: Cardiolite 7/56/43 LVEF 38%, no ischemia. Echo 5/16 showed EF 25-30%, moderately dilated LV, severe central MR likely functional from  annular dilatation. Most likely etiology of cardiomyopathy is doxorubicin cardiotoxicity. Echo prior to doxorubicin initiation in 4/15 showed normal EF. NYHA class II-III symptoms currently with no volume overload. COPD may be playing role in her dyspnea. - Continue Lasix 20 mg daily prn.  - Add spironolactone 12.5 mg daily. BMET today and in 10 days. - Continue afterload reduction with hydralazine 25 mg tid + imdur 30 mg.  - Increase bisoprolol to 5 mg BID  (using bisoprolol for superior beta-1 selectivity with suspected COPD). - If creatinine remains stable, can try ACEI in future.  - Echo in 2 months to reassess EF.  - With evidence of peritoneal disease will not pursue ICD.   2. Mitral regurgitation: Severe central MR on echo in the hospital in 5/16. Probably functional from annular dilatation. Treat cardiomyopathy medically to hopefully improve this. May be candidate for MV clipping down the road.  3. CKD: Creatinine actually  improved some when last checked, 1.8 (11/2014).   - Now seeing Nephrology - BMET today.  4. Smoker: Back to 1 ppd.  Strongly encouraged her to quit.  Nicotine patches do not work, afraid to try Chantix. 5. CVA: Prior CVA. Continue Lipitor 20 mg. Decrease ASA to 81. Good lipids 5/16.   6. Ovarian cancer: Continued treatment at [facility].   Follow up in 2 months with echo. BMET today and 10 days.   Satira Mccallum Tillery PA-C 03/08/2015   Patient seen with PA, agree with the above note.  Will increase bisoprolol to 5 mg bid and add spironolactone 12.5 mg daily.  She is not volume overloaded on exam, I suspect that COPD plays a role in her dyspnea.  Echo in 2 months with followup.   Loralie Champagne 03/08/2015

## 2015-03-08 NOTE — Patient Instructions (Signed)
Routine lab work today. (bmet) Will notify you of abnormal results  STOP Spironolactone.  INCREASE Bisoprolol 5mg  to twice a day.  DECREASE Aspirin to 81mg  daily.   FOLLOW UP: 54months with ECHO.  LABS in 10 days (bmet)

## 2015-03-10 ENCOUNTER — Ambulatory Visit (HOSPITAL_COMMUNITY): Payer: Medicare Other

## 2015-03-12 ENCOUNTER — Ambulatory Visit (HOSPITAL_COMMUNITY): Payer: Medicare Other

## 2015-03-15 ENCOUNTER — Ambulatory Visit (HOSPITAL_COMMUNITY): Payer: Medicare Other

## 2015-03-17 ENCOUNTER — Ambulatory Visit (HOSPITAL_COMMUNITY): Payer: Medicare Other

## 2015-03-17 ENCOUNTER — Ambulatory Visit (HOSPITAL_COMMUNITY)
Admission: RE | Admit: 2015-03-17 | Discharge: 2015-03-17 | Disposition: A | Discharge status: Home / Self Care | Payer: Medicare Other | Source: Ambulatory Visit | Attending: Internal Medicine | Admitting: Internal Medicine

## 2015-03-17 DIAGNOSIS — I5043 Acute on chronic combined systolic (congestive) and diastolic (congestive) heart failure: Secondary | ICD-10-CM

## 2015-03-17 MED ORDER — BISOPROLOL FUMARATE 5 MG PO TABS: 5.0000 mg | ORAL_TABLET | Freq: Two times a day (BID) | ORAL | Status: AC

## 2015-03-17 MED ORDER — SPIRONOLACTONE 25 MG PO TABS: 12.5000 mg | ORAL_TABLET | Freq: Every day | ORAL | Status: DC

## 2015-03-17 NOTE — Patient Instructions (Signed)
INCREASE Bisoprolol to 5 mg, one tab twice a daily START Spironolactone 12.5 mg, one half tab daily   Labs needed in 10-14 days

## 2015-03-19 ENCOUNTER — Ambulatory Visit (HOSPITAL_COMMUNITY): Payer: Medicare Other

## 2015-03-22 ENCOUNTER — Ambulatory Visit (HOSPITAL_COMMUNITY): Payer: Medicare Other

## 2015-03-24 ENCOUNTER — Ambulatory Visit (HOSPITAL_COMMUNITY): Payer: Medicare Other

## 2015-03-26 ENCOUNTER — Ambulatory Visit (HOSPITAL_COMMUNITY): Payer: Medicare Other

## 2015-03-29 ENCOUNTER — Ambulatory Visit (HOSPITAL_COMMUNITY): Payer: Medicare Other

## 2015-03-31 ENCOUNTER — Ambulatory Visit (HOSPITAL_COMMUNITY)
Admission: RE | Admit: 2015-03-31 | Discharge: 2015-03-31 | Disposition: A | Discharge status: Home / Self Care | Payer: Medicare Other | Source: Ambulatory Visit | Attending: Cardiology | Admitting: Cardiology

## 2015-03-31 ENCOUNTER — Ambulatory Visit (HOSPITAL_COMMUNITY): Payer: Medicare Other

## 2015-03-31 DIAGNOSIS — I5022 Chronic systolic (congestive) heart failure: Secondary | ICD-10-CM | POA: Diagnosis not present

## 2015-03-31 DIAGNOSIS — N183 Chronic kidney disease, stage 3 unspecified: Secondary | ICD-10-CM

## 2015-03-31 LAB — BASIC METABOLIC PANEL
ANION GAP: 8 (ref 5–15)
BUN: 24 mg/dL — ABNORMAL HIGH (ref 6–20)
CHLORIDE: 114 mmol/L — AB (ref 101–111)
CO2: 18 mmol/L — AB (ref 22–32)
Calcium: 9 mg/dL (ref 8.9–10.3)
Creatinine, Ser: 1.62 mg/dL — ABNORMAL HIGH (ref 0.44–1.00)
GFR calc non Af Amer: 34 mL/min — ABNORMAL LOW (ref >60)
GFR, EST AFRICAN AMERICAN: 40 mL/min — AB (ref >60)
Glucose, Bld: 103 mg/dL — ABNORMAL HIGH (ref 65–99)
POTASSIUM: 4.8 mmol/L (ref 3.5–5.1)
SODIUM: 140 mmol/L (ref 135–145)

## 2015-04-02 ENCOUNTER — Ambulatory Visit (HOSPITAL_COMMUNITY): Payer: Medicare Other

## 2015-04-05 ENCOUNTER — Ambulatory Visit (HOSPITAL_COMMUNITY): Payer: Medicare Other

## 2015-04-09 ENCOUNTER — Ambulatory Visit (HOSPITAL_COMMUNITY): Payer: Medicare Other

## 2015-04-12 ENCOUNTER — Ambulatory Visit (HOSPITAL_COMMUNITY): Payer: Medicare Other

## 2015-04-16 ENCOUNTER — Ambulatory Visit (HOSPITAL_COMMUNITY): Payer: Medicare Other

## 2015-04-19 ENCOUNTER — Ambulatory Visit (HOSPITAL_COMMUNITY): Payer: Medicare Other

## 2015-04-26 ENCOUNTER — Ambulatory Visit (HOSPITAL_COMMUNITY): Payer: Medicare Other

## 2015-04-30 ENCOUNTER — Ambulatory Visit (HOSPITAL_COMMUNITY): Payer: Medicare Other

## 2015-05-03 ENCOUNTER — Ambulatory Visit (HOSPITAL_COMMUNITY): Payer: Medicare Other

## 2015-05-07 ENCOUNTER — Ambulatory Visit (HOSPITAL_COMMUNITY): Payer: Medicare Other

## 2015-05-10 ENCOUNTER — Ambulatory Visit (HOSPITAL_COMMUNITY): Payer: Medicare Other

## 2015-05-12 ENCOUNTER — Encounter (HOSPITAL_COMMUNITY): Payer: Medicare Other

## 2015-05-12 ENCOUNTER — Ambulatory Visit (HOSPITAL_COMMUNITY): Payer: Medicare Other

## 2015-05-14 ENCOUNTER — Ambulatory Visit (HOSPITAL_COMMUNITY): Payer: Medicare Other

## 2015-05-17 ENCOUNTER — Ambulatory Visit (HOSPITAL_COMMUNITY): Payer: Medicare Other

## 2015-05-19 ENCOUNTER — Encounter (HOSPITAL_COMMUNITY): Payer: Self-pay | Admitting: Vascular Surgery

## 2015-05-21 ENCOUNTER — Ambulatory Visit (HOSPITAL_COMMUNITY): Payer: Medicare Other

## 2015-05-24 ENCOUNTER — Ambulatory Visit (HOSPITAL_COMMUNITY): Payer: Medicare Other

## 2015-05-28 ENCOUNTER — Ambulatory Visit (HOSPITAL_COMMUNITY): Payer: Medicare Other

## 2015-05-31 ENCOUNTER — Ambulatory Visit (HOSPITAL_COMMUNITY): Payer: Medicare Other

## 2015-06-03 ENCOUNTER — Telehealth (HOSPITAL_COMMUNITY): Payer: Self-pay | Admitting: Vascular Surgery

## 2015-06-03 NOTE — Telephone Encounter (Signed)
Pt is now on Hospice pt will not be be back to clinic

## 2015-06-04 ENCOUNTER — Ambulatory Visit (HOSPITAL_COMMUNITY): Payer: Medicare Other

## 2015-06-05 ENCOUNTER — Other Ambulatory Visit (HOSPITAL_COMMUNITY): Payer: Self-pay | Admitting: Internal Medicine

## 2015-06-07 ENCOUNTER — Ambulatory Visit (HOSPITAL_COMMUNITY): Payer: Medicare Other

## 2015-06-18 ENCOUNTER — Other Ambulatory Visit (HOSPITAL_COMMUNITY): Payer: Self-pay | Admitting: Student

## 2015-07-28 DEATH — deceased

## 2016-07-05 IMAGING — DX DG KNEE 1-2V*R*
2 series · 2 of 2 positions shown · non-contrast
Comparison: Knee radiograph 04/15/2011

CLINICAL DATA: Patient with history of rheumatoid arthritis. Status
post fall 2 days prior. Unable to ambulate. Initial encounter.

EXAM:
RIGHT KNEE - 1-2 VIEW

[knee ap]
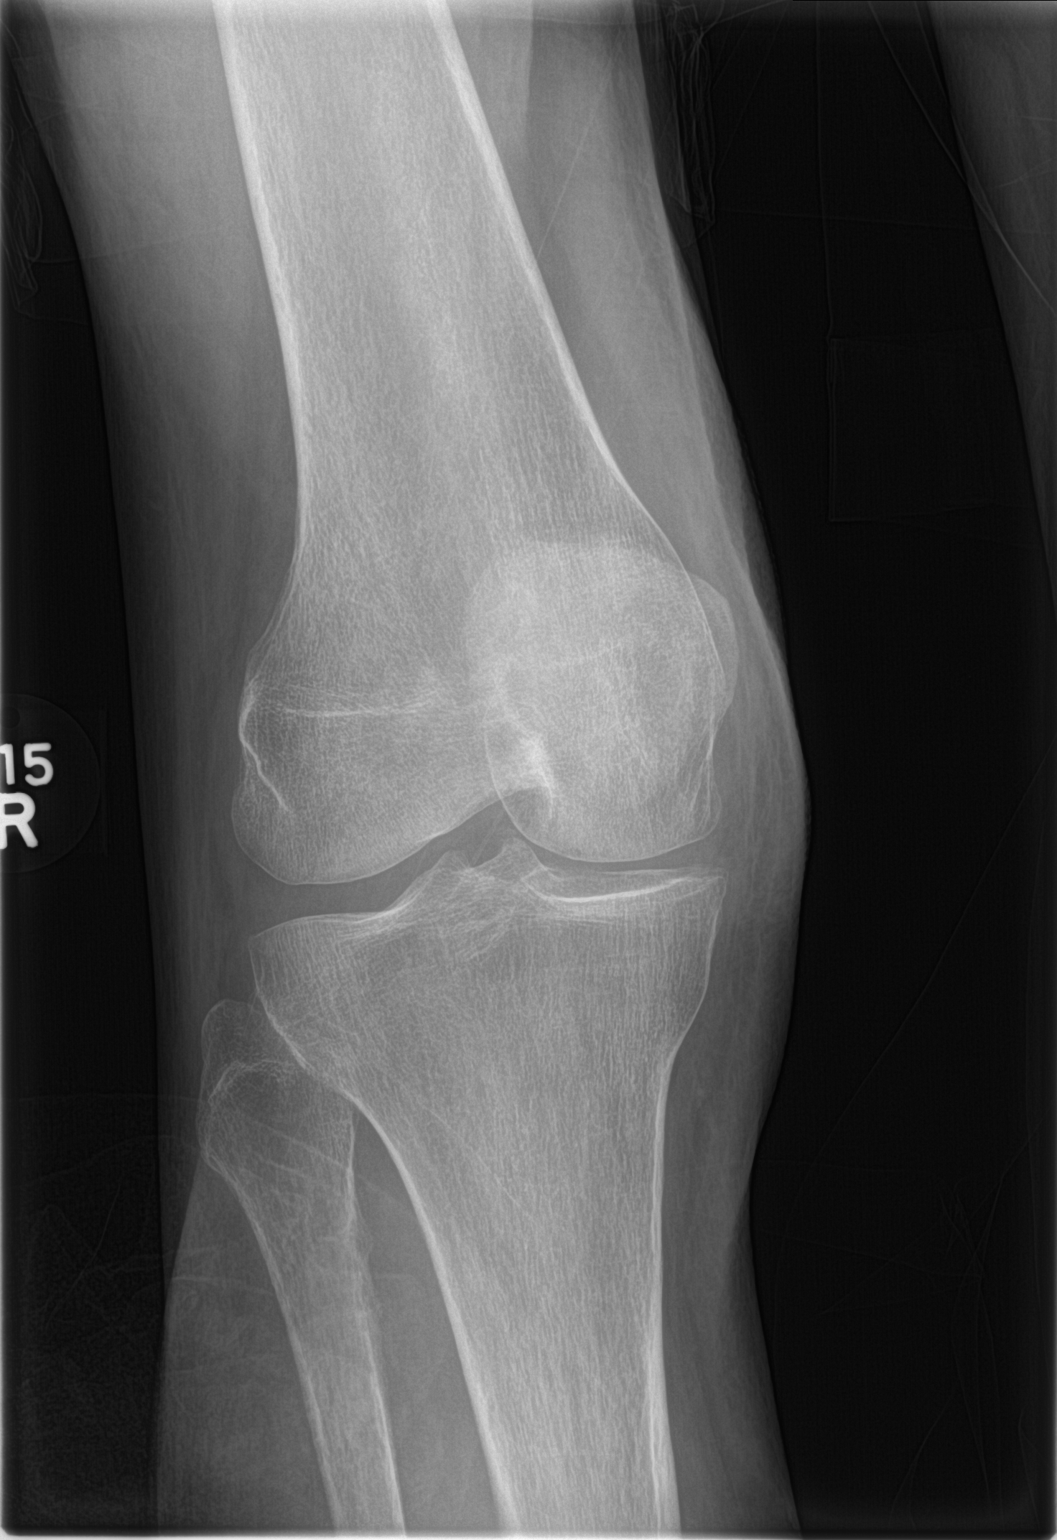

[knee lat]
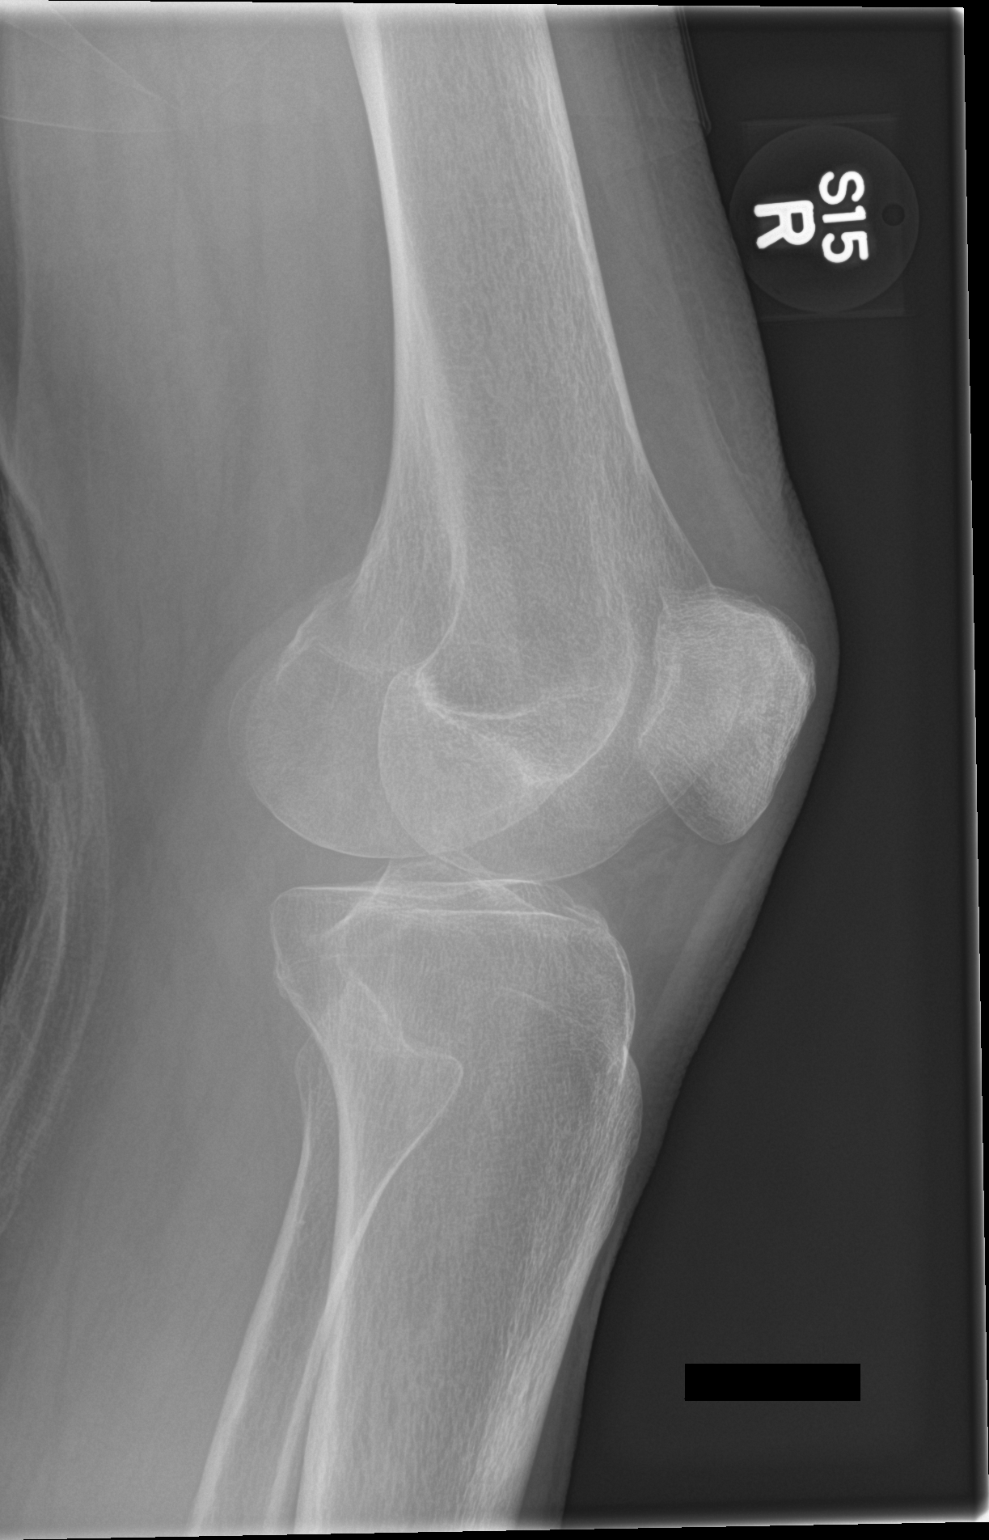

[2 of 2 positions shown; findings below may reference images not displayed]

FINDINGS: Normal anatomic alignment. No evidence for acute displaced fracture.
Lateral view is limited however there is no definite large joint
effusion.
IMPRESSION: No evidence for acute displaced fracture. Limited lateral view
demonstrates no large effusion. When patient clinically able
consider dedicated multi view radiographs.

## 2016-07-05 IMAGING — DX DG PELVIS 1-2V
1 series · 1 of 1 positions shown · non-contrast
Comparison: CT abdomen pelvis January 14, 2010

CLINICAL DATA: Status post fall 2 days ago, feeling a pop in her
right hip and femur.

EXAM:
PELVIS - 1-2 VIEW

[pelvis ap]
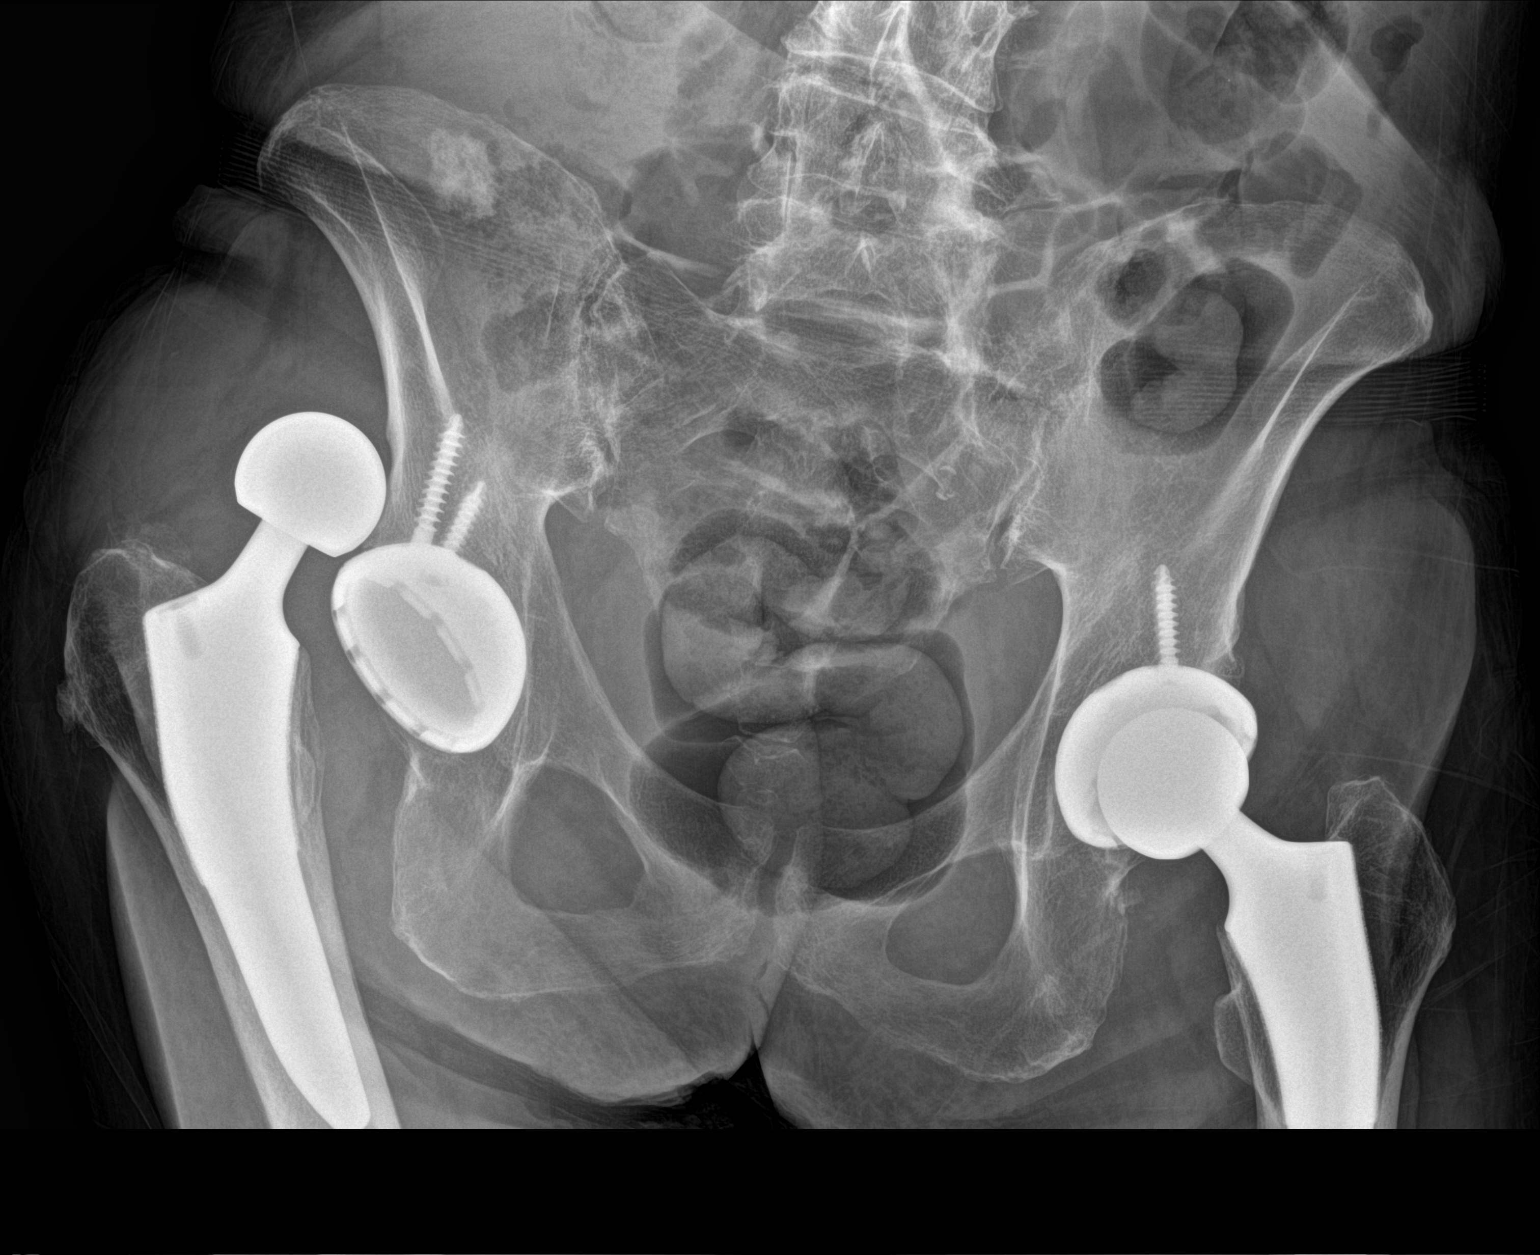

[1 of 1 positions shown; findings below may reference images not displayed]

FINDINGS: Total bilateral hip replacements are noted. The left hip replacement
is normal. There is superior dislocation of the femoral component of
the right hip replacement. Focal sclerosis is identified in the
superior right iliac wing unchanged compared to prior CT.
IMPRESSION: Superior dislocation of the femoral component of right hip
replacement.

## 2016-11-09 IMAGING — US US ABDOMEN COMPLETE
1 series · 13 of 25 positions shown · non-contrast
Comparison: None.

CLINICAL DATA: Renal failure. History of metastatic ovarian cancer.

EXAM:
ULTRASOUND ABDOMEN COMPLETE

[Series 1: us abdomen complete · 0.21mm/px · 13 of 51 slices shown]
[im 1/51]
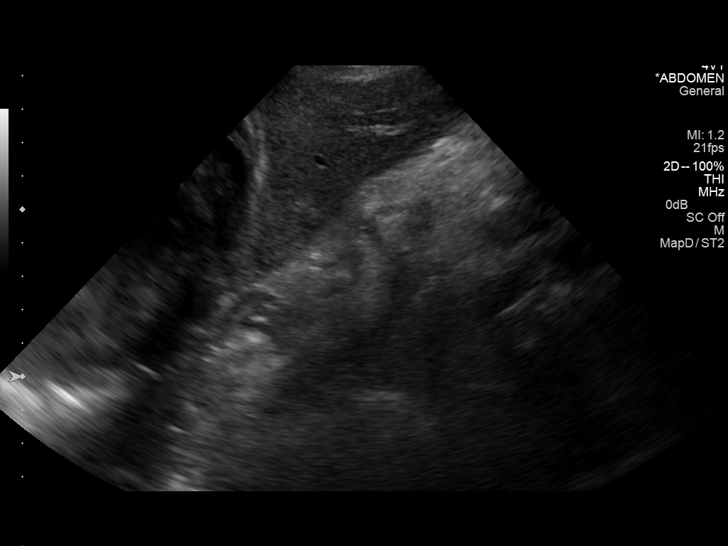
[im 5/51]
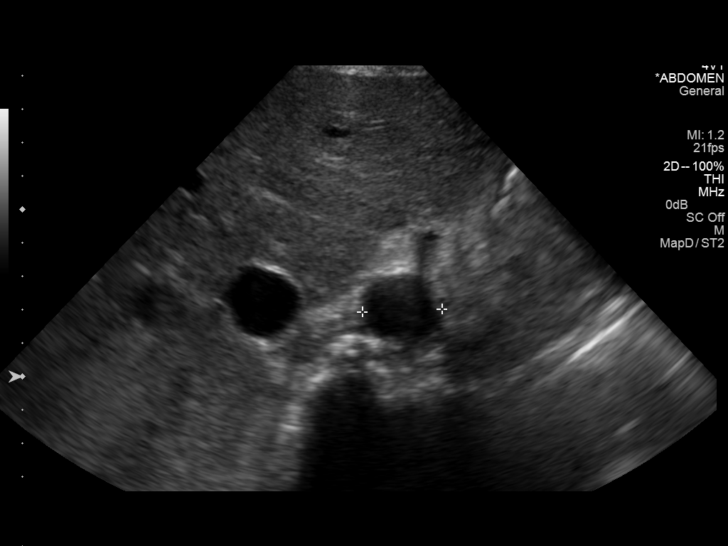
[im 9/51]
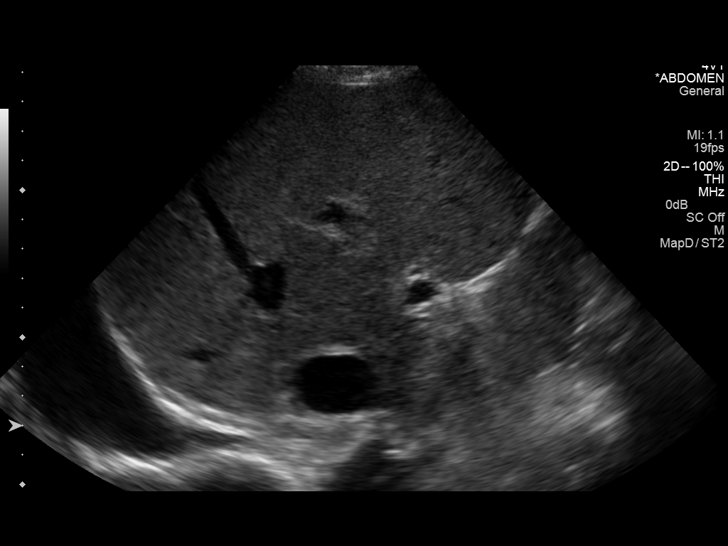
[im 13/51]
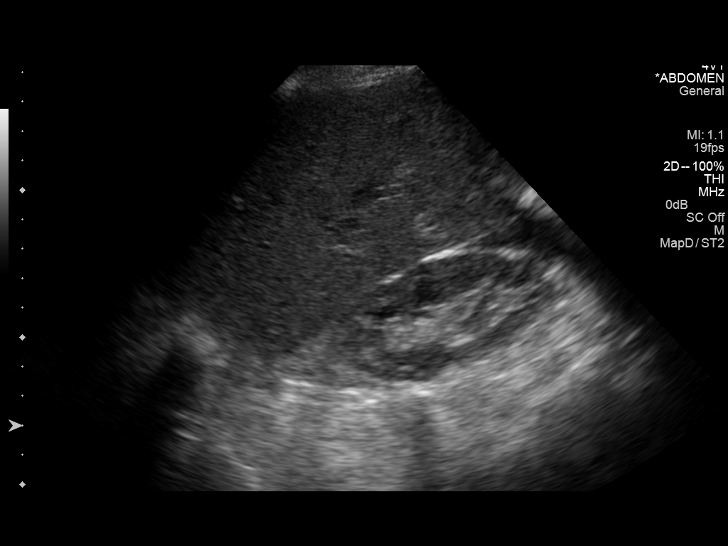
[im 17/51]
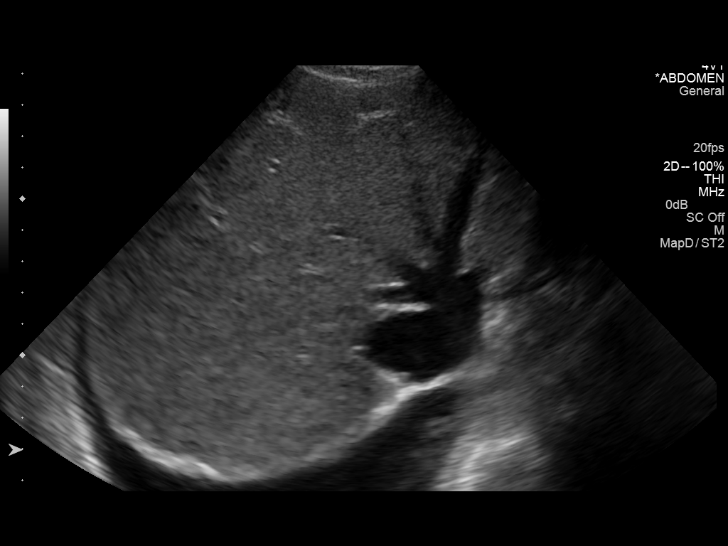
[im 21/51]
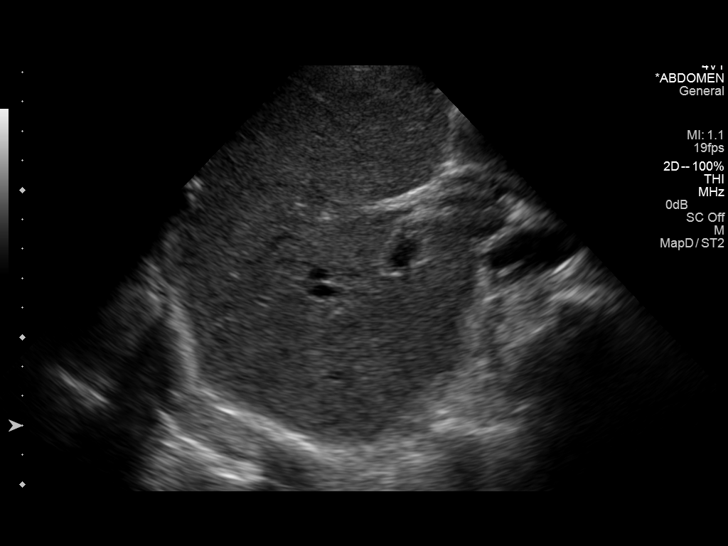
[im 26/51]
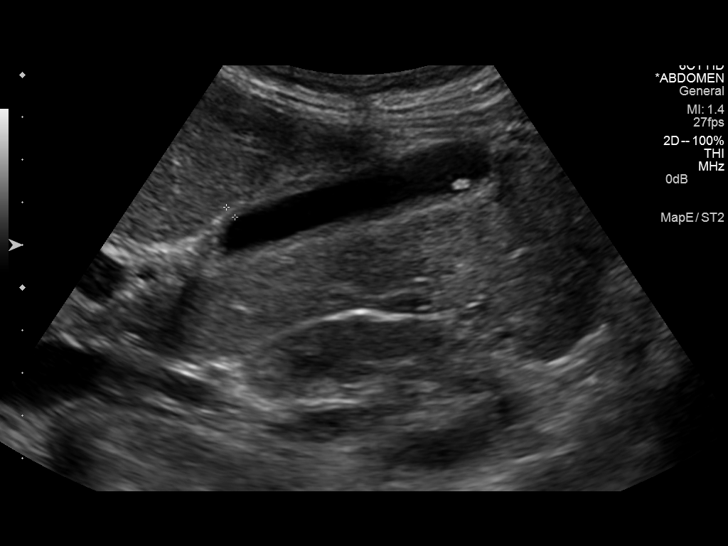
[im 30/51]
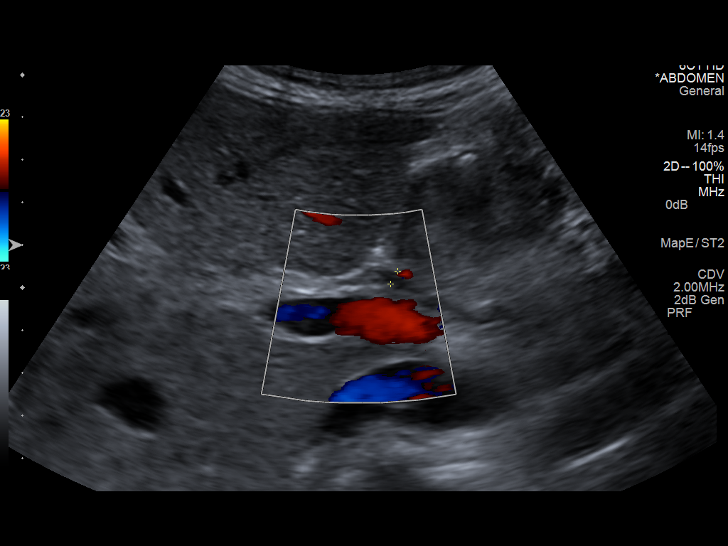
[im 34/51]
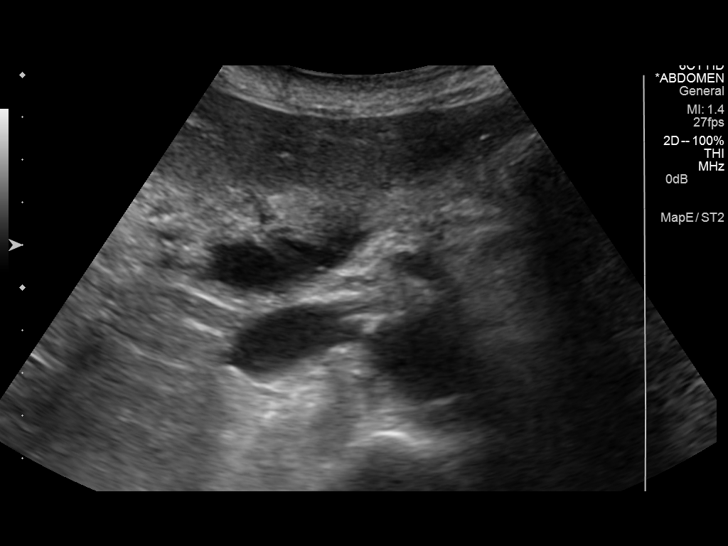
[im 38/51]
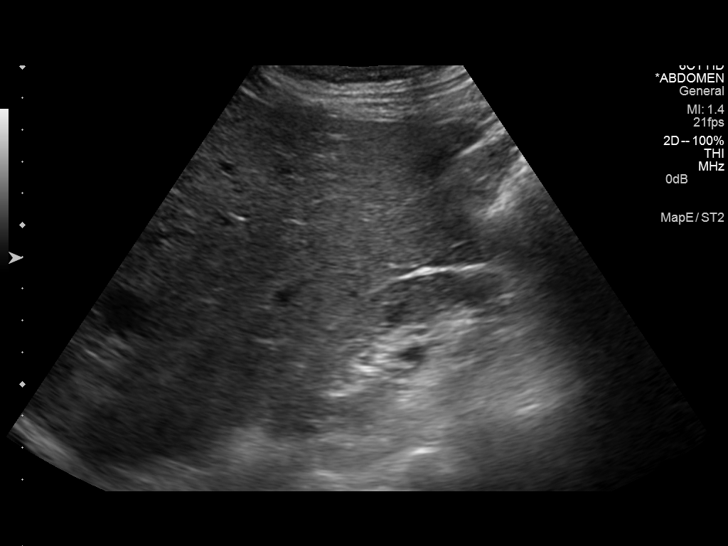
[im 42/51]
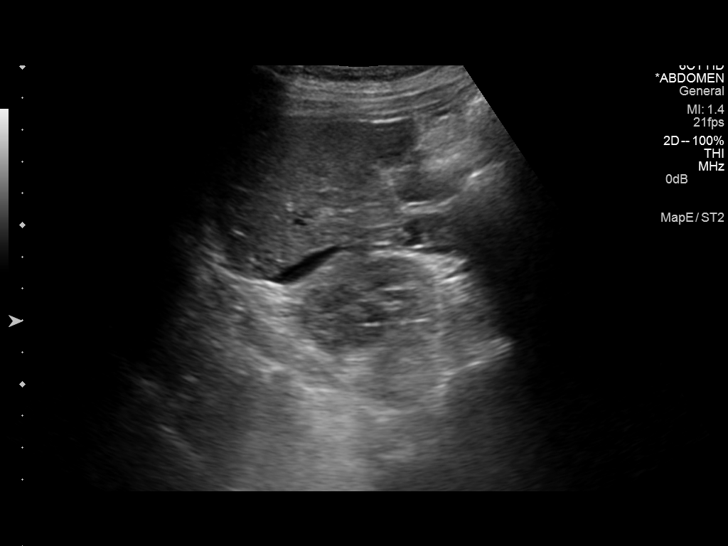
[im 46/51]
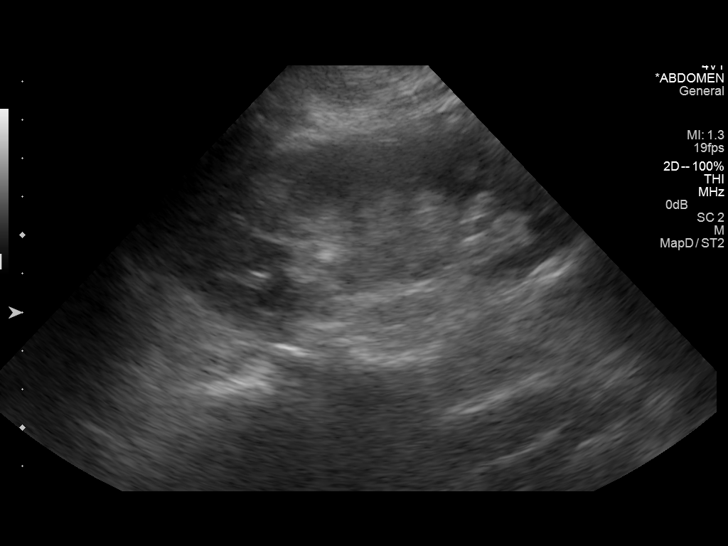
[im 51/51]
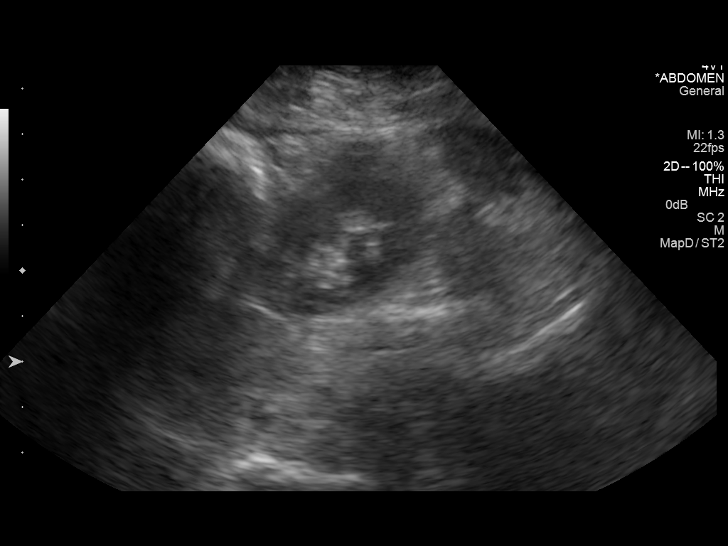

[13 of 25 positions shown; findings below may reference images not displayed]

FINDINGS: Gallbladder: Cholelithiasis without pericholecystic fluid. Top
normal gallbladder wall thickness which appears uniform. Negative
sonographic Murphy sign.

Common bile duct: Diameter: 3.4 mm

Liver: No focal lesion identified. Within normal limits in
parenchymal echogenicity. Trace amount of perihepatic fluid.

IVC: No abnormality visualized.

Pancreas: Limited visualization secondary to overlying bowel gas.
Visualized portion unremarkable.

Spleen: Size and appearance within normal limits.

Right Kidney: Length: 10.4 cm. Increased renal cortical
echogenicity. No mass or hydronephrosis visualized.

Left Kidney: Length: 10.8 cm. Increased renal cortical
echogenicity.. Echogenicity within normal limits. No mass or
hydronephrosis visualized.

Abdominal aorta: Abdominal aorta measures 2.9 cm in AP diameter.
There is atherosclerotic disease of the abdominal aorta.

Other findings: None.
IMPRESSION: 1. Cholelithiasis without sonographic evidence of acute
cholecystitis.
2. Trace perihepatic free fluid.
3. Mild increased renal cortical echogenicity as can be seen with
medical renal disease or acute kidney injury.
4. Ectatic abdominal aorta at risk for aneurysm development.
Recommend followup by ultrasound in 5 years. This recommendation
follows ACR consensus guidelines: White Paper of the ACR Incidental
